# Patient Record
Sex: Male | Born: 1960 | Race: White | Hispanic: No | Marital: Married | Smoking: Never smoker
Health system: Southern US, Community
[De-identification: ages and names within clinical notes are randomized; demographics above are authoritative.]

## PROBLEM LIST (undated history)

## (undated) DIAGNOSIS — I10 Essential (primary) hypertension: Secondary | ICD-10-CM

## (undated) HISTORY — DX: Essential (primary) hypertension: I10

---

## 2019-10-26 ENCOUNTER — Other Ambulatory Visit: Payer: Self-pay

## 2019-10-26 DIAGNOSIS — Z20822 Contact with and (suspected) exposure to covid-19: Secondary | ICD-10-CM

## 2019-10-28 LAB — NOVEL CORONAVIRUS, NAA: SARS-CoV-2, NAA: NOT DETECTED

## 2019-12-19 ENCOUNTER — Ambulatory Visit: Payer: Self-pay | Admitting: Family Medicine

## 2019-12-23 ENCOUNTER — Other Ambulatory Visit: Payer: Self-pay

## 2019-12-23 ENCOUNTER — Encounter: Payer: Self-pay | Admitting: Family Medicine

## 2019-12-23 ENCOUNTER — Ambulatory Visit (INDEPENDENT_AMBULATORY_CARE_PROVIDER_SITE_OTHER): Payer: Managed Care, Other (non HMO) | Admitting: Family Medicine

## 2019-12-23 VITALS — BP 128/80 | HR 68 | Temp 95.5°F | Resp 12 | Ht 67.0 in | Wt 183.0 lb

## 2019-12-23 DIAGNOSIS — R739 Hyperglycemia, unspecified: Secondary | ICD-10-CM

## 2019-12-23 DIAGNOSIS — Z13220 Encounter for screening for lipoid disorders: Secondary | ICD-10-CM

## 2019-12-23 DIAGNOSIS — I10 Essential (primary) hypertension: Secondary | ICD-10-CM

## 2019-12-23 DIAGNOSIS — M2550 Pain in unspecified joint: Secondary | ICD-10-CM

## 2019-12-23 DIAGNOSIS — Z1159 Encounter for screening for other viral diseases: Secondary | ICD-10-CM | POA: Diagnosis not present

## 2019-12-23 DIAGNOSIS — Z9189 Other specified personal risk factors, not elsewhere classified: Secondary | ICD-10-CM

## 2019-12-23 LAB — SEDIMENTATION RATE: Sed Rate: 6 mm/hr (ref 0–20)

## 2019-12-23 LAB — C-REACTIVE PROTEIN: CRP: <1 mg/dL (ref 0.5–20.0)

## 2019-12-23 LAB — BASIC METABOLIC PANEL
BUN: 24 mg/dL — ABNORMAL HIGH (ref 6–23)
CO2: 29 mEq/L (ref 19–32)
Calcium: 9.2 mg/dL (ref 8.4–10.5)
Chloride: 106 mEq/L (ref 96–112)
Creatinine, Ser: 1.03 mg/dL (ref 0.40–1.50)
GFR: 74.31 mL/min (ref >60.00)
Glucose, Bld: 99 mg/dL (ref 70–99)
Potassium: 4.5 mEq/L (ref 3.5–5.1)
Sodium: 141 mEq/L (ref 135–145)

## 2019-12-23 LAB — LIPID PANEL
Cholesterol: 167 mg/dL (ref 0–200)
HDL: 59.9 mg/dL (ref >39.00)
LDL Cholesterol: 85 mg/dL (ref 0–99)
NonHDL: 106.79
Total CHOL/HDL Ratio: 3
Triglycerides: 111 mg/dL (ref 0.0–149.0)
VLDL: 22.2 mg/dL (ref 0.0–40.0)

## 2019-12-23 LAB — HEMOGLOBIN A1C: Hgb A1c MFr Bld: 5.2 % (ref 4.6–6.5)

## 2019-12-23 MED ORDER — LISINOPRIL 10 MG PO TABS: 10.0000 mg | ORAL_TABLET | Freq: Every day | ORAL | 3 refills | Status: DC

## 2019-12-23 NOTE — Patient Instructions (Signed)
A few things to remember from today's visit:   Polyarthralgia - Plan: Rheumatoid Factor, Cyclic citrul peptide antibody, IgG, C-reactive protein, Sed Rate (ESR)  Hyperglycemia - Plan: Hemoglobin A1c  Benign essential hypertension - Plan: Basic Metabolic Panel, lisinopril (ZESTRIL) 10 MG tablet  Encounter for HCV screening test for high risk patient - Plan: Hepatitis C Antibody  Screening for lipid disorders - Plan: Lipid panel  No changes today.  Please be sure medication list is accurate. If a new problem present, please set up appointment sooner than planned today.

## 2019-12-23 NOTE — Progress Notes (Signed)
HPI:   Daniel Fritz is a 59 y.o. male, who is here today to establish care.  Former PCP: Moved from [place] 3 months ago. Last preventive routine visit: He has periodic executive physicals, last one a year ago.  Chronic medical problems: HTN. Dx'ed 18 months ago. He is on Lisinopril 10 mg daily.  He does not check BP at home. Denies severe/frequent headache, visual changes, chest pain, dyspnea, palpitation, claudication, focal weakness, or edema.  States that his glucose has been mildly elevated. No Hx of HLD or DM.   Concerns today:   He wonders if he would benefit from getting the shingles vaccine now. Shingles 7 years ago, since then he has noted intermittent rash on right costal area every year. No numbness or tingling. He has not noted vesicular lesions. + Stress.  Concerned about joint pain, afraid of being RA. Achy like pain. Knees,left-sided back, and right hip pain. FHx of RA.  No edema or erythema. No limitation of ROM.. Pain does  Not interferes with daily activities. He takes Advil 1-2 per week.  Review of Systems  Constitutional: Negative for activity change, appetite change, fatigue and fever.  HENT: Negative for nosebleeds, sore throat and trouble swallowing.   Eyes: Negative for redness and visual disturbance.  Respiratory: Negative for cough and wheezing.   Gastrointestinal: Negative for abdominal pain, nausea and vomiting.  Endocrine: Negative for cold intolerance and heat intolerance.  Genitourinary: Negative for decreased urine volume and hematuria.  Musculoskeletal: Negative for gait problem and myalgias.  Neurological: Negative for syncope and facial asymmetry.  Rest see pertinent positives and negatives per HPI.   No current outpatient medications on file prior to visit.   No current facility-administered medications on file prior to visit.    Past Medical History:  Diagnosis Date  . Hypertension    No Known  Allergies  History reviewed. No pertinent family history.  Social History   Socioeconomic History  . Marital status: Married    Spouse name: Not on file  . Number of children: Not on file  . Years of education: Not on file  . Highest education level: Not on file  Occupational History  . Not on file  Tobacco Use  . Smoking status: Never Smoker  . Smokeless tobacco: Never Used  Substance and Sexual Activity  . Alcohol use: Not on file  . Drug use: Not on file  . Sexual activity: Not on file  Other Topics Concern  . Not on file  Social History Narrative  . Not on file   Social Determinants of Health   Financial Resource Strain:   . Difficulty of Paying Living Expenses: Not on file  Food Insecurity:   . Worried About Charity fundraiser in the Last Year: Not on file  . Ran Out of Food in the Last Year: Not on file  Transportation Needs:   . Lack of Transportation (Medical): Not on file  . Lack of Transportation (Non-Medical): Not on file  Physical Activity:   . Days of Exercise per Week: Not on file  . Minutes of Exercise per Session: Not on file  Stress:   . Feeling of Stress : Not on file  Social Connections:   . Frequency of Communication with Friends and Family: Not on file  . Frequency of Social Gatherings with Friends and Family: Not on file  . Attends Religious Services: Not on file  . Active Member of Clubs or Organizations: Not on  file  . Attends Archivist Meetings: Not on file  . Marital Status: Not on file    Vitals:   12/23/19 0939  BP: 128/80  Pulse: 68  Resp: 12  Temp: (!) 95.5 F (35.3 C)  SpO2: 96%    Body mass index is 28.66 kg/m.  Physical Exam  Nursing note reviewed. Constitutional: He is oriented to person, place, and time. He appears well-developed. No distress.  HENT:  Head: Normocephalic and atraumatic.  Mouth/Throat: Oropharynx is clear and moist and mucous membranes are normal.  Eyes: Pupils are equal, round, and  reactive to light. Conjunctivae are normal.  Cardiovascular: Normal rate and regular rhythm.  No murmur heard. Pulses:      Dorsalis pedis pulses are 2+ on the right side and 2+ on the left side.  Respiratory: Effort normal and breath sounds normal. No respiratory distress.  GI: Soft. He exhibits no mass. There is no hepatomegaly. There is no abdominal tenderness.  Musculoskeletal:        General: No edema.     Lumbar back: No bony tenderness.     Right knee: No deformity, effusion or erythema. Normal range of motion.     Left knee: No deformity or effusion. Normal range of motion. No tenderness.     Comments: Knee crepitus R>L . No signs of synovitis.  Lymphadenopathy:    He has no cervical adenopathy.  Neurological: He is alert and oriented to person, place, and time. He has normal strength. No cranial nerve deficit. Gait normal.  Skin: Skin is warm. No rash noted. No erythema.  Psychiatric: He has a normal mood and affect.  Well groomed, good eye contact.   ASSESSMENT AND PLAN:  Daniel Fritz was seen today for establish care.  Diagnoses and all orders for this visit:  Lab Results  Component Value Date   ESRSEDRATE 6 12/23/2019   Lab Results  Component Value Date   CRP <1.0 12/23/2019   Lab Results  Component Value Date   HGBA1C 5.2 12/23/2019   Lab Results  Component Value Date   CREATININE 1.03 12/23/2019   BUN 24 (H) 12/23/2019   NA 141 12/23/2019   K 4.5 12/23/2019   CL 106 12/23/2019   CO2 29 12/23/2019    Polyarthralgia Hx does not suggest RA but rather osteoarthritis. Caution with chronic NSAID's advice.Tylenol is a better choice, 500 mg 3-4 times per day as needed.  -     Rheumatoid Factor -     Cyclic citrul peptide antibody, IgG -     C-reactive protein -     Sed Rate (ESR)  Hyperglycemia Healthy life style for primary prevention. Further recommendations according to A1C.  -     Hemoglobin A1c  Benign essential hypertension BP adequately  controlled. No changes in current management. Monitor BP regularly. Low salt diet to continue.  -     Basic Metabolic Panel -     lisinopril (ZESTRIL) 10 MG tablet; Take 1 tablet (10 mg total) by mouth daily.  Encounter for HCV screening test for high risk patient -     Hepatitis C Antibody  Screening for lipid disorders -     Lipid panel    Return in about 1 year (around 12/22/2020) for Before if needed..     Duana Benedict G. [place], MD  [facility]. [place] office.

## 2019-12-24 ENCOUNTER — Encounter: Payer: Self-pay | Admitting: Family Medicine

## 2019-12-26 LAB — CYCLIC CITRUL PEPTIDE ANTIBODY, IGG: Cyclic Citrullin Peptide Ab: <16 UNITS

## 2019-12-26 LAB — HEPATITIS C ANTIBODY
Hepatitis C Ab: NONREACTIVE
SIGNAL TO CUT-OFF: 0.03 (ref <1.00)

## 2019-12-26 LAB — RHEUMATOID FACTOR: Rheumatoid fact SerPl-aCnc: <14 IU/mL (ref <14)

## 2019-12-28 ENCOUNTER — Encounter: Payer: Self-pay | Admitting: Family Medicine

## 2020-02-24 ENCOUNTER — Telehealth: Payer: Self-pay | Admitting: Family Medicine

## 2020-02-24 NOTE — Telephone Encounter (Signed)
Pt stated that the medication Lisinopril is making him cough and he would like to be switched to Losartan if possible.  [facility] [address] FAX: [phone]   Pt can be reached at [phone] -ok to leave detailed message per pt

## 2020-02-27 ENCOUNTER — Other Ambulatory Visit: Payer: Self-pay | Admitting: Family Medicine

## 2020-02-27 MED ORDER — LOSARTAN POTASSIUM 25 MG PO TABS: 25.0000 mg | ORAL_TABLET | Freq: Every day | ORAL | 1 refills | Status: DC

## 2020-02-27 NOTE — Telephone Encounter (Signed)
Losartan 25 mg sent to the pharmacy. Please arrange appointment for 3 months med follow-up. Thanks, BJ

## 2020-02-27 NOTE — Telephone Encounter (Signed)
I left patient a voicemail letting him know the new Rx was sent in & to give the office a call back to schedule a 3 month follow up.

## 2020-03-15 ENCOUNTER — Ambulatory Visit: Payer: Managed Care, Other (non HMO) | Attending: Internal Medicine

## 2020-03-15 DIAGNOSIS — Z23 Encounter for immunization: Secondary | ICD-10-CM

## 2020-03-15 NOTE — Progress Notes (Signed)
   Covid-19 Vaccination Clinic  Name:  Maddox Hlavaty    MRN: [id number] DOB: 17-Nov-1961  03/15/2020  Mr. Jarquin was observed post Covid-19 immunization for 15 minutes without incident. He was provided with Vaccine Information Sheet and instruction to access the V-Safe system.   Mr. Mittelstaedt was instructed to call 911 with any severe reactions post vaccine: Marland Kitchen Difficulty breathing  . Swelling of face and throat  . A fast heartbeat  . A bad rash all over body  . Dizziness and weakness   Immunizations Administered    Name Date Dose VIS Date Route   Pfizer COVID-19 Vaccine 03/15/2020 11:30 AM 0.3 mL 11/25/2019 Intramuscular   Manufacturer: [employer], [employer]   Lot: [id number]   NDC: [address]-2

## 2020-08-31 ENCOUNTER — Telehealth: Payer: Self-pay | Admitting: Family Medicine

## 2020-08-31 MED ORDER — LOSARTAN POTASSIUM 25 MG PO TABS: 25.0000 mg | ORAL_TABLET | Freq: Every day | ORAL | 1 refills | Status: DC

## 2020-08-31 NOTE — Telephone Encounter (Signed)
Pts spouse is calling in stating that pt is out of losartan (COZAAR) 25 MG  [facility]:  [facility] [address]  [phone]

## 2020-08-31 NOTE — Telephone Encounter (Signed)
Rx has been sent in as requested.  

## 2020-10-08 ENCOUNTER — Other Ambulatory Visit: Payer: Self-pay

## 2020-10-08 ENCOUNTER — Encounter: Payer: Self-pay | Admitting: Family Medicine

## 2020-10-08 ENCOUNTER — Ambulatory Visit (INDEPENDENT_AMBULATORY_CARE_PROVIDER_SITE_OTHER): Payer: Managed Care, Other (non HMO) | Admitting: Family Medicine

## 2020-10-08 VITALS — BP 142/90 | HR 83 | Resp 12 | Ht 67.0 in | Wt 183.0 lb

## 2020-10-08 DIAGNOSIS — E663 Overweight: Secondary | ICD-10-CM

## 2020-10-08 DIAGNOSIS — R9431 Abnormal electrocardiogram [ECG] [EKG]: Secondary | ICD-10-CM | POA: Diagnosis not present

## 2020-10-08 DIAGNOSIS — Z23 Encounter for immunization: Secondary | ICD-10-CM | POA: Diagnosis not present

## 2020-10-08 DIAGNOSIS — I10 Essential (primary) hypertension: Secondary | ICD-10-CM | POA: Diagnosis not present

## 2020-10-08 MED ORDER — LOSARTAN POTASSIUM 50 MG PO TABS: 50.0000 mg | ORAL_TABLET | Freq: Every day | ORAL | 2 refills | Status: DC

## 2020-10-08 MED ORDER — LOSARTAN POTASSIUM 50 MG PO TABS: 50.0000 mg | ORAL_TABLET | Freq: Every day | ORAL | 3 refills | Status: DC

## 2020-10-08 NOTE — Progress Notes (Signed)
HPI: DanielDaniel Fritz is a 59 y.o. male, who is here today for follow up.   He was last seen on 12/23/2019. No new problems since his last visit. He is concerned about elevated BP. He has checked BP at the grocery store, 140s/90s. He has not noted unusual/severe headache, visual changes, CP, palpitations, dyspnea, edema, or focal weakness.  Currently he is on losartan 25 mg daily. He is tolerating medication well.  He has been under some stress. He quit his job (it was aggravating stress) and his mother passed last week from CVA. She had hx of atrial fib.  Lab Results  Component Value Date   CREATININE 1.03 12/23/2019   BUN 24 (H) 12/23/2019   NA 141 12/23/2019   K 4.5 12/23/2019   CL 106 12/23/2019   CO2 29 12/23/2019   He is concerned about CVD and would like to discuss prevention strategies. No known Hx of CAD,DM II,or HLD.  Lab Results  Component Value Date   HGBA1C 5.2 12/23/2019   Lab Results  Component Value Date   CHOL 167 12/23/2019   HDL 59.90 12/23/2019   LDLCALC 85 12/23/2019   TRIG 111.0 12/23/2019   CHOLHDL 3 12/23/2019   He is trying to follow a healthful diet and exercising regularly.  Review of Systems  Constitutional: Negative for activity change, appetite change and fever.  HENT: Negative for nosebleeds and sore throat.   Respiratory: Negative for cough and wheezing.   Gastrointestinal: Negative for abdominal pain, nausea and vomiting.  Genitourinary: Negative for decreased urine volume, dysuria and hematuria.  Musculoskeletal: Negative for gait problem and myalgias.  Neurological: Negative for syncope, facial asymmetry and weakness.  Psychiatric/Behavioral: Negative for confusion.   Rest of ROS, see pertinent positives sand negatives in HPI  No current outpatient medications on file prior to visit.   No current facility-administered medications on file prior to visit.   Past Medical History:  Diagnosis Date  . Hypertension    No Known  Allergies  Social History   Socioeconomic History  . Marital status: Married    Spouse name: Not on file  . Number of children: Not on file  . Years of education: Not on file  . Highest education level: Not on file  Occupational History  . Not on file  Tobacco Use  . Smoking status: Never Smoker  . Smokeless tobacco: Never Used  Substance and Sexual Activity  . Alcohol use: Not on file  . Drug use: Not on file  . Sexual activity: Not on file  Other Topics Concern  . Not on file  Social History Narrative  . Not on file   Social Determinants of Health   Financial Resource Strain:   . Difficulty of Paying Living Expenses: Not on file  Food Insecurity:   . Worried About Programme researcher, broadcasting/film/videounning Out of Food in the Last Year: Not on file  . Ran Out of Food in the Last Year: Not on file  Transportation Needs:   . Lack of Transportation (Medical): Not on file  . Lack of Transportation (Non-Medical): Not on file  Physical Activity:   . Days of Exercise per Week: Not on file  . Minutes of Exercise per Session: Not on file  Stress:   . Feeling of Stress : Not on file  Social Connections:   . Frequency of Communication with Friends and Family: Not on file  . Frequency of Social Gatherings with Friends and Family: Not on file  . Attends  Religious Services: Not on file  . Active Member of Clubs or Organizations: Not on file  . Attends Banker Meetings: Not on file  . Marital Status: Not on file   Vitals:   10/08/20 1134  BP: (!) 142/90  Pulse: 83  Resp: 12  SpO2: 96%   Wt Readings from Last 3 Encounters:  10/08/20 183 lb (83 kg)  12/23/19 183 lb (83 kg)   Body mass index is 28.66 kg/m.  Physical Exam Vitals and nursing note reviewed.  Constitutional:      General: He is not in acute distress.    Appearance: He is well-developed and well-groomed.  HENT:     Head: Normocephalic and atraumatic.     Mouth/Throat:     Mouth: Mucous membranes are moist.     Pharynx:  Oropharynx is clear.  Eyes:     Conjunctiva/sclera: Conjunctivae normal.     Pupils: Pupils are equal, round, and reactive to light.  Cardiovascular:     Rate and Rhythm: Normal rate and regular rhythm.     Pulses:          Dorsalis pedis pulses are 2+ on the right side and 2+ on the left side.     Heart sounds: No murmur heard.   Pulmonary:     Effort: Pulmonary effort is normal. No respiratory distress.     Breath sounds: Normal breath sounds.  Abdominal:     Palpations: Abdomen is soft. There is no hepatomegaly or mass.     Tenderness: There is no abdominal tenderness.  Lymphadenopathy:     Cervical: No cervical adenopathy.  Skin:    General: Skin is warm.     Findings: No erythema or rash.  Neurological:     General: No focal deficit present.     Mental Status: He is alert and oriented to person, place, and time.     Cranial Nerves: No cranial nerve deficit.     Gait: Gait normal.   ASSESSMENT AND PLAN:  Daniel Fritz was seen today for follow-up.  Orders Placed This Encounter  Procedures  . Flu Vaccine QUAD 36+ mos IM  . BASIC METABOLIC PANEL WITH GFR  . Ambulatory referral to Cardiology  . EKG 12-Lead   Lab Results  Component Value Date   CREATININE 1.07 10/08/2020   BUN 19 10/08/2020   NA 139 10/08/2020   K 4.6 10/08/2020   CL 105 10/08/2020   CO2 26 10/08/2020    Benign essential hypertension Re-checked: 130/98. Recommend increasing dose of losartan from 25 mg to 50 mg. Monitor BP at home. Continue low-salt diet.  We discussed current recommendations in regard to primary CVD prevention.  Overweight (BMI 25.0-29.9) Weight has been stable. Continue following a healthful diet and regular physical activity.  Abnormal EKG Reporting having stress test and EKG's in the past, he has not been told about abnormalities. Asymptomatic. EKG today:SR, LAD, ? LAFB,,uns T wave abnormalities inferior leads.  No other EKG available for comparison. Appointment  with cardiology will be arranged.  Need for influenza vaccination -     Flu Vaccine QUAD 36+ mos IM   Return in about 2 months (around 12/18/2020) for CPE.  Karthikeya Funke G. [place], MD  [facility]. [place] office.   A few things to remember from today's visit:   Benign essential hypertension - Plan: EKG 12-Lead  Need for influenza vaccination - Plan: Flu Vaccine QUAD 36+ mos IM  Today losartan dose increased from 25  mg to 50 mg. Monitor blood pressure at home. Continue low-salt diet.  Heart Disease Prevention Heart disease is the leading cause of death in the world. Coronary artery disease is the most common cause of heart disease. This condition results when cholesterol and other substances (plaque) build up inside the walls of the blood vessels that supply your heart muscle (arteries). This buildup in arteries is called atherosclerosis. You can take actions to lower your risk of heart disease. How can heart disease affect me? Heart disease can cause many unpleasant symptoms and complications, such as:  Chest pain (angina).  Reduced or blocked blood flow to your heart. This can cause: ? Irregular heartbeats (arrhythmias). ? Heart attack. ? Heart failure. What can increase my risk? The following factors may make you more likely to develop this condition:  High blood pressure (hypertension).  High cholesterol.  Smoking.  A diet high in saturated fats or trans fats.  Lack of physical activity.  Obesity.  Drinking too much alcohol.  Diabetes.  Having a family history of heart disease. What actions can I take to prevent heart disease? Nutrition   Eat a heart-healthy eating plan as told by your health care provider. Examples include the DASH (Dietary Approaches to Stop Hypertension) eating plan or the Mediterranean diet.  Generally, it is recommended that you: ? Eat less salt (sodium). Ask your health care provider how much sodium is safe for you. Most  people should have less than 2,300 mg each day. ? Limit unhealthy fats, such as saturated and trans fats, in your diet. You can do this by eating low-fat dairy products, eating less red meat, and avoiding processed foods. ? Eat healthy fats (omega-3 fatty acids). These are found in fish, such as mackerel or salmon. ? Eat more fruits and vegetables. You should try to fill one-half of your plate with fruits and vegetables at each meal. ? Eat more whole grains. ? Avoid foods and drinks that have added sugars. Lifestyle   Get regular exercise. This is one of the most important things you can do for your health. Generally, it is recommended that you: ? Exercise for at least 30 minutes on most days of the week (150 minutes each week). The exercise should increase your heart rate and make you sweat (aerobic exercise). ? Add strength exercises on at least 2 days each week.  Do not use any products that contain nicotine or tobacco, such as cigarettes and e-cigarettes. These can damage your heart and blood vessels. If you need help quitting, ask your health care provider. Alcohol use  Do not drink alcohol if: ? Your health care provider tells you not to drink. ? You are pregnant, may be pregnant, or are planning to become pregnant.  If you drink alcohol, limit how much you have: ? 0-1 drink a day for women. ? 0-2 drinks a day for men.  Be aware of how much alcohol is in your drink. In the [place] one drink equals one typical bottle of beer (12 oz), one-half glass of wine (5 oz), or one shot of hard liquor (1 oz). Medicines  Take over-the-counter and prescription medicines only as told by your health care provider.  Ask your health care provider whether you should take an aspirin every day. Taking aspirin may help reduce your risk of heart disease and stroke.  Depending on your risk factors, your health care provider may prescribe medicines to lower your risk of heart disease or to control related  conditions.  You may take medicine to: ? Lower cholesterol. ? Control blood pressure. ? Control diabetes. General information  Keep your blood pressure under control, as recommended by your health care provider. For most healthy people, the upper number of your blood pressure (systolic) should be no higher than 120, and the lower number (diastolic) no higher than 80. Treatment may be needed if your blood pressure is higher than 130/80.  Have your blood pressure checked at least every two years. Your health care provider may check your blood pressure more often if you have high blood pressure.  After age 21, have your cholesterol checked every 4-6 years. If you have risk factors for heart disease, you may need to have it checked more frequently. Treatment may be needed if your cholesterol is high.  Have your body mass index (BMI) checked every year. Your health care provider can calculate your BMI from your height and weight.  Work with your health care provider to lose weight, if needed, or to maintain a healthy weight. Where to find more information:  Centers for Disease Control and Prevention: https://ball-collins.biz/  American Heart Association: www.heart.org ? Take a free online heart disease risk quiz to better understand your personal risk factors. Summary  Heart disease is the leading cause of death in the world.  Heart disease can cause chest pain, abnormal heart rhythms, heart attack, and heart failure.  High blood pressure, high cholesterol, and smoking are the main risk factors for heart disease, although other factors also contribute.  You can take actions to lower your chances of developing heart disease. Work with your health care provider to reduce your risk by following a heart-healthy diet, being physically active, and controlling your weight, blood pressure, and cholesterol level. This information is not intended to replace advice given to you by your health care  provider. Make sure you discuss any questions you have with your health care provider. Document Revised: 12/16/2017 Document Reviewed: 12/16/2017 Elsevier Patient Education  [employer].  If you need refills please call your pharmacy. Do not use My Chart to request refills or for acute issues that need immediate attention.    Please be sure medication list is accurate. If a new problem present, please set up appointment sooner than planned today.

## 2020-10-08 NOTE — Patient Instructions (Signed)
A few things to remember from today's visit:   Benign essential hypertension - Plan: EKG 12-Lead  Need for influenza vaccination - Plan: Flu Vaccine QUAD 36+ mos IM  Today losartan dose increased from 25 mg to 50 mg. Monitor blood pressure at home. Continue low-salt diet.  Heart Disease Prevention Heart disease is the leading cause of death in the world. Coronary artery disease is the most common cause of heart disease. This condition results when cholesterol and other substances (plaque) build up inside the walls of the blood vessels that supply your heart muscle (arteries). This buildup in arteries is called atherosclerosis. You can take actions to lower your risk of heart disease. How can heart disease affect me? Heart disease can cause many unpleasant symptoms and complications, such as:  Chest pain (angina).  Reduced or blocked blood flow to your heart. This can cause: ? Irregular heartbeats (arrhythmias). ? Heart attack. ? Heart failure. What can increase my risk? The following factors may make you more likely to develop this condition:  High blood pressure (hypertension).  High cholesterol.  Smoking.  A diet high in saturated fats or trans fats.  Lack of physical activity.  Obesity.  Drinking too much alcohol.  Diabetes.  Having a family history of heart disease. What actions can I take to prevent heart disease? Nutrition   Eat a heart-healthy eating plan as told by your health care provider. Examples include the DASH (Dietary Approaches to Stop Hypertension) eating plan or the Mediterranean diet.  Generally, it is recommended that you: ? Eat less salt (sodium). Ask your health care provider how much sodium is safe for you. Most people should have less than 2,300 mg each day. ? Limit unhealthy fats, such as saturated and trans fats, in your diet. You can do this by eating low-fat dairy products, eating less red meat, and avoiding processed foods. ? Eat  healthy fats (omega-3 fatty acids). These are found in fish, such as mackerel or salmon. ? Eat more fruits and vegetables. You should try to fill one-half of your plate with fruits and vegetables at each meal. ? Eat more whole grains. ? Avoid foods and drinks that have added sugars. Lifestyle   Get regular exercise. This is one of the most important things you can do for your health. Generally, it is recommended that you: ? Exercise for at least 30 minutes on most days of the week (150 minutes each week). The exercise should increase your heart rate and make you sweat (aerobic exercise). ? Add strength exercises on at least 2 days each week.  Do not use any products that contain nicotine or tobacco, such as cigarettes and e-cigarettes. These can damage your heart and blood vessels. If you need help quitting, ask your health care provider. Alcohol use  Do not drink alcohol if: ? Your health care provider tells you not to drink. ? You are pregnant, may be pregnant, or are planning to become pregnant.  If you drink alcohol, limit how much you have: ? 0-1 drink a day for women. ? 0-2 drinks a day for men.  Be aware of how much alcohol is in your drink. In the [place] one drink equals one typical bottle of beer (12 oz), one-half glass of wine (5 oz), or one shot of hard liquor (1 oz). Medicines  Take over-the-counter and prescription medicines only as told by your health care provider.  Ask your health care provider whether you should take an aspirin every day. Taking  aspirin may help reduce your risk of heart disease and stroke.  Depending on your risk factors, your health care provider may prescribe medicines to lower your risk of heart disease or to control related conditions. You may take medicine to: ? Lower cholesterol. ? Control blood pressure. ? Control diabetes. General information  Keep your blood pressure under control, as recommended by your health care provider. For most  healthy people, the upper number of your blood pressure (systolic) should be no higher than 120, and the lower number (diastolic) no higher than 80. Treatment may be needed if your blood pressure is higher than 130/80.  Have your blood pressure checked at least every two years. Your health care provider may check your blood pressure more often if you have high blood pressure.  After age 16, have your cholesterol checked every 4-6 years. If you have risk factors for heart disease, you may need to have it checked more frequently. Treatment may be needed if your cholesterol is high.  Have your body mass index (BMI) checked every year. Your health care provider can calculate your BMI from your height and weight.  Work with your health care provider to lose weight, if needed, or to maintain a healthy weight. Where to find more information:  Centers for Disease Control and Prevention: https://ball-collins.biz/  American Heart Association: www.heart.org ? Take a free online heart disease risk quiz to better understand your personal risk factors. Summary  Heart disease is the leading cause of death in the world.  Heart disease can cause chest pain, abnormal heart rhythms, heart attack, and heart failure.  High blood pressure, high cholesterol, and smoking are the main risk factors for heart disease, although other factors also contribute.  You can take actions to lower your chances of developing heart disease. Work with your health care provider to reduce your risk by following a heart-healthy diet, being physically active, and controlling your weight, blood pressure, and cholesterol level. This information is not intended to replace advice given to you by your health care provider. Make sure you discuss any questions you have with your health care provider. Document Revised: 12/16/2017 Document Reviewed: 12/16/2017 Elsevier Patient Education  [employer].  If you need refills please call  your pharmacy. Do not use My Chart to request refills or for acute issues that need immediate attention.    Please be sure medication list is accurate. If a new problem present, please set up appointment sooner than planned today.

## 2020-10-09 LAB — BASIC METABOLIC PANEL WITH GFR
BUN: 19 mg/dL (ref 7–25)
CO2: 26 mmol/L (ref 20–32)
Calcium: 9.5 mg/dL (ref 8.6–10.3)
Chloride: 105 mmol/L (ref 98–110)
Creat: 1.07 mg/dL (ref 0.70–1.33)
GFR, Est African American: 88 mL/min/{1.73_m2} (ref >=60)
GFR, Est Non African American: 76 mL/min/{1.73_m2} (ref >=60)
Glucose, Bld: 99 mg/dL (ref 65–99)
Potassium: 4.6 mmol/L (ref 3.5–5.3)
Sodium: 139 mmol/L (ref 135–146)

## 2020-10-09 NOTE — Progress Notes (Signed)
Cardiology Office Note:   Date:  10/10/2020  NAME:  Daniel Fritz    MRN: [id number] DOB:  01-12-1961   PCP:  [place], Betty G, MD  Cardiologist:  No primary care provider on file.  Electrophysiologist:  None   Referring MD: [place], Betty G, MD   Chief Complaint  Patient presents with  . New Patient (Initial Visit)  . Shortness of Breath   History of Present Illness:   Daniel Fritz is a 59 y.o. male with a hx of HTN who is being seen today for the evaluation of abnormal EKG at the request of [place], Timoteo Expose, MD.  He reports he was recently seen by his PCP.  EKG showed nonspecific ST-T changes.  He was sent for evaluation.  He does have a history of hypertension is well controlled on losartan.  He reports he has no chest pain or shortness of breath.  He reports when he does some heavy exertion he may get a little shortness of breath but no chest pain or tightness.  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  Most recent cholesterol shown below.  A1c below.  Cardiovascular examination is normal.  He is a never smoker.  Rarely consumes alcohol.  No illicit drugs.  There is no strong family history of heart disease.  Overall without symptoms.  We did discuss calcium scoring for further risk ratification.  He is interested.  He also needs an echocardiogram for his EKG.  T chol 167, HDL 59, LDL 85, TG 111, A1c 5.5  Past Medical History: Past Medical History:  Diagnosis Date  . Hypertension     Past Surgical History: History reviewed. No pertinent surgical history.  Current Medications: Current Meds  Medication Sig  . losartan (COZAAR) 50 MG tablet Take 1 tablet (50 mg total) by mouth daily.     Allergies:    Patient has no known allergies.   Social History: Social History   Socioeconomic History  . Marital status: Married    Spouse name: Not on file  . Number of children: 2  . Years of education: Not on file  . Highest education level: Not on file    Occupational History  . Occupation: Finance  Tobacco Use  . Smoking status: Never Smoker  . Smokeless tobacco: Never Used  Substance and Sexual Activity  . Alcohol use: Yes    Alcohol/week: 2.0 standard drinks    Types: 2 Glasses of wine per week  . Drug use: Never  . Sexual activity: Not on file  Other Topics Concern  . Not on file  Social History Narrative  . Not on file   Social Determinants of Health   Financial Resource Strain:   . Difficulty of Paying Living Expenses: Not on file  Food Insecurity:   . Worried About Programme researcher, broadcasting/film/video in the Last Year: Not on file  . Ran Out of Food in the Last Year: Not on file  Transportation Needs:   . Lack of Transportation (Medical): Not on file  . Lack of Transportation (Non-Medical): Not on file  Physical Activity:   . Days of Exercise per Week: Not on file  . Minutes of Exercise per Session: Not on file  Stress:   . Feeling of Stress : Not on file  Social Connections:   . Frequency of Communication with Friends and Family: Not on file  . Frequency of Social Gatherings with Friends and Family: Not on file  .  Attends Religious Services: Not on file  . Active Member of Clubs or Organizations: Not on file  . Attends Banker Meetings: Not on file  . Marital Status: Not on file     Family History: The patient's family history includes Arrhythmia in his mother; Stroke in his mother.  ROS:   All other ROS reviewed and negative. Pertinent positives noted in the HPI.     EKGs/Labs/Other Studies Reviewed:   The following studies were personally reviewed by me today:  EKG:  EKG is ordered today.  The ekg ordered today demonstrates normal sinus rhythm, heart rate 67, nonspecific ST-T changes, and was personally reviewed by me.   Recent Labs: 10/08/2020: BUN 19; Creat 1.07; Potassium 4.6; Sodium 139   Recent Lipid Panel    Component Value Date/Time   CHOL 167 12/23/2019 1012   TRIG 111.0 12/23/2019 1012   HDL  59.90 12/23/2019 1012   CHOLHDL 3 12/23/2019 1012   VLDL 22.2 12/23/2019 1012   LDLCALC 85 12/23/2019 1012    Physical Exam:   VS:  BP 130/86 (BP Location: Left Arm, Patient Position: Sitting, Cuff Size: Normal)   Pulse 67   Ht 5\' 7"  (1.702 m)   Wt 187 lb (84.8 kg)   BMI 29.29 kg/m    Wt Readings from Last 3 Encounters:  10/10/20 187 lb (84.8 kg)  10/08/20 183 lb (83 kg)  12/23/19 183 lb (83 kg)    General: Well nourished, well developed, in no acute distress Heart: Atraumatic, normal size  Eyes: PEERLA, EOMI  Neck: Supple, no JVD Endocrine: No thryomegaly Cardiac: Normal S1, S2; RRR; no murmurs, rubs, or gallops Lungs: Clear to auscultation bilaterally, no wheezing, rhonchi or rales  Abd: Soft, nontender, no hepatomegaly  Ext: No edema, pulses 2+ Musculoskeletal: No deformities, BUE and BLE strength normal and equal Skin: Warm and dry, no rashes   Neuro: Alert and oriented to person, place, time, and situation, CNII-XII grossly intact, no focal deficits  Psych: Normal mood and affect   ASSESSMENT:   Daniel Fritz is a 59 y.o. male who presents for the following: 1. Nonspecific abnormal electrocardiogram (ECG) (EKG)   2. Primary hypertension     PLAN:   1. Nonspecific abnormal electrocardiogram (ECG) (EKG) 2. Primary hypertension -EKG with nonspecific ST-T changes.  Recommend echocardiogram.  Suspect these are all blood pressure related.  Cardiovascular examination is normal. -We have discussed coronary calcium scoring for further stratification.  We will proceed with this.  I will follow-up with him the results by phone. If everything is normal he will see 41 as needed.  Disposition: Return if symptoms worsen or fail to improve.  Medication Adjustments/Labs and Tests Ordered: Current medicines are reviewed at length with the patient today.  Concerns regarding medicines are outlined above.  Orders Placed This Encounter  Procedures  . CT CARDIAC SCORING  . EKG 12-Lead   . ECHOCARDIOGRAM COMPLETE   No orders of the defined types were placed in this encounter.   Patient Instructions  Medication Instructions:  Continue same medications   Lab Work: None ordered   Testing/Procedures: Echo Coronary Calcium Score   Follow-Up: At [facility], you and your health needs are our priority.  As part of our continuing mission to provide you with exceptional heart care, we have created designated Provider Care Teams.  These Care Teams include your primary Cardiologist (physician) and Advanced Practice Providers (APPs -  Physician Assistants and Nurse Practitioners) who all work together to provide  you with the care you need, when you need it.  We recommend signing up for the patient portal called "MyChart".  Sign up information is provided on this After Visit Summary.  MyChart is used to connect with patients for Virtual Visits (Telemedicine).  Patients are able to view lab/test results, encounter notes, upcoming appointments, etc.  Non-urgent messages can be sent to your provider as well.   To learn more about what you can do with MyChart, go to [email or web address].    Your next appointment:  As Needed   The format for your next appointment: Office     Provider: Dr.O'Neal       Signed, Lenna Gilford. Flora Lipps, MD [facility]  [address] [place], [place] [address] [phone]  10/10/2020 5:00 PM

## 2020-10-10 ENCOUNTER — Ambulatory Visit (INDEPENDENT_AMBULATORY_CARE_PROVIDER_SITE_OTHER): Payer: Managed Care, Other (non HMO) | Admitting: Cardiovascular Disease

## 2020-10-10 ENCOUNTER — Encounter: Payer: Self-pay | Admitting: Cardiovascular Disease

## 2020-10-10 ENCOUNTER — Other Ambulatory Visit: Payer: Self-pay

## 2020-10-10 VITALS — BP 130/86 | HR 67 | Ht 67.0 in | Wt 187.0 lb

## 2020-10-10 DIAGNOSIS — I10 Essential (primary) hypertension: Secondary | ICD-10-CM

## 2020-10-10 DIAGNOSIS — R9431 Abnormal electrocardiogram [ECG] [EKG]: Secondary | ICD-10-CM

## 2020-10-10 NOTE — Patient Instructions (Signed)
Medication Instructions:  Continue same medications   Lab Work: None ordered   Testing/Procedures: Echo Coronary Calcium Score   Follow-Up: At [facility], you and your health needs are our priority.  As part of our continuing mission to provide you with exceptional heart care, we have created designated Provider Care Teams.  These Care Teams include your primary Cardiologist (physician) and Advanced Practice Providers (APPs -  Physician Assistants and Nurse Practitioners) who all work together to provide you with the care you need, when you need it.  We recommend signing up for the patient portal called "MyChart".  Sign up information is provided on this After Visit Summary.  MyChart is used to connect with patients for Virtual Visits (Telemedicine).  Patients are able to view lab/test results, encounter notes, upcoming appointments, etc.  Non-urgent messages can be sent to your provider as well.   To learn more about what you can do with MyChart, go to [email or web address].    Your next appointment:  As Needed   The format for your next appointment: Office     Provider: Dr.O'Neal

## 2020-10-31 ENCOUNTER — Other Ambulatory Visit: Payer: Self-pay

## 2020-10-31 ENCOUNTER — Ambulatory Visit
Admission: RE | Admit: 2020-10-31 | Discharge: 2020-10-31 | Disposition: A | Discharge status: Home / Self Care | Payer: Self-pay | Source: Ambulatory Visit | Attending: Cardiovascular Disease | Admitting: Cardiovascular Disease

## 2020-10-31 ENCOUNTER — Ambulatory Visit (HOSPITAL_COMMUNITY): Payer: Managed Care, Other (non HMO) | Attending: Internal Medicine

## 2020-10-31 DIAGNOSIS — R9431 Abnormal electrocardiogram [ECG] [EKG]: Secondary | ICD-10-CM

## 2020-10-31 DIAGNOSIS — I10 Essential (primary) hypertension: Secondary | ICD-10-CM | POA: Diagnosis not present

## 2020-10-31 LAB — ECHOCARDIOGRAM COMPLETE
Area-P 1/2: 2.42 cm2
S' Lateral: 2.8 cm

## 2020-11-06 ENCOUNTER — Telehealth: Payer: Self-pay | Admitting: *Deleted

## 2020-11-06 DIAGNOSIS — E785 Hyperlipidemia, unspecified: Secondary | ICD-10-CM

## 2020-11-06 MED ORDER — ROSUVASTATIN CALCIUM 20 MG PO TABS: 20.0000 mg | ORAL_TABLET | Freq: Every day | ORAL | 3 refills | Status: DC

## 2020-11-06 NOTE — Telephone Encounter (Signed)
Pt aware of his blood work, pt agree to start Crestor 20 mg daily and an Aspirin 81 mg by mouth daily, Fasting Lipid lab slip mail to pt to get blood work done in 3 month. appt schedule on 02/23 with Dr Flora Lipps

## 2020-11-06 NOTE — Telephone Encounter (Signed)
-----   Message from Sande Rives, MD sent at 11/01/2020  7:27 AM EST ----- Need to start aspirin 81 mg daily and crestor 20 mg daily. Also needs to see me in 3 months with a fasting lipid the week before.   -W

## 2020-12-18 ENCOUNTER — Encounter: Payer: Self-pay | Admitting: Family Medicine

## 2020-12-28 ENCOUNTER — Encounter: Payer: Managed Care, Other (non HMO) | Admitting: Family Medicine

## 2021-02-03 NOTE — Progress Notes (Signed)
Cardiology Office Note:   Date:  02/06/2021  NAME:  Daniel Fritz    MRN: [id number] DOB:  [birth year]   PCP:  [place], Betty G, MD  Cardiologist:  No primary care provider on file.   Referring MD: [place], Betty G, MD   Chief Complaint  Patient presents with  . Coronary Artery Disease   History of Present Illness:   Daniel Fritz is a 60 y.o. male with a hx of CAD (elevated calcium score) and HLD who presents for follow-up.  We discussed his recent calcium scoring.  He was started on Crestor.  Doing well on this.  Most recent LDL cholesterol not at goal.  He was started on Crestor 20 mg daily.  Also started on aspirin.  No bleeding issues.  He reports he is exercising daily.  This includes up to 30 minutes of weight training or aerobic activity.  No significant chest pain or pressure.  No shortness of breath.  BP 138/82.  He is on losartan.  Seems to be well controlled.  Denies any symptoms today.  We do need to recheck his lipid profile.  He is okay to do this in the next few weeks.  Problem List 1. CAD -coronary calcium score 121 (75th percentile) 2. HLD -T chol 167, HDL 59, LDL 85, TG 111  Past Medical History: Past Medical History:  Diagnosis Date  . Hypertension     Past Surgical History: History reviewed. No pertinent surgical history.  Current Medications: Current Meds  Medication Sig  . aspirin EC 81 MG tablet Take 81 mg by mouth daily. Swallow whole.  . losartan (COZAAR) 50 MG tablet Take 1 tablet (50 mg total) by mouth daily.  . rosuvastatin (CRESTOR) 20 MG tablet Take 1 tablet (20 mg total) by mouth daily.     Allergies:    Patient has no known allergies.   Social History: Social History   Socioeconomic History  . Marital status: Married    Spouse name: Not on file  . Number of children: 2  . Years of education: Not on file  . Highest education level: Not on file  Occupational History  . Occupation: Finance  Tobacco Use  . Smoking status: Never Smoker  .  Smokeless tobacco: Never Used  Substance and Sexual Activity  . Alcohol use: Yes    Alcohol/week: 2.0 standard drinks    Types: 2 Glasses of wine per week  . Drug use: Never  . Sexual activity: Not on file  Other Topics Concern  . Not on file  Social History Narrative  . Not on file   Social Determinants of Health   Financial Resource Strain: Not on file  Food Insecurity: Not on file  Transportation Needs: Not on file  Physical Activity: Not on file  Stress: Not on file  Social Connections: Not on file     Family History: The patient's family history includes Arrhythmia in his mother; Stroke in his mother.  ROS:   All other ROS reviewed and negative. Pertinent positives noted in the HPI.     EKGs/Labs/Other Studies Reviewed:   The following studies were personally reviewed by me today:  Recent Labs: 10/08/2020: BUN 19; Creat 1.07; Potassium 4.6; Sodium 139   Recent Lipid Panel    Component Value Date/Time   CHOL 167 12/23/2019 1012   TRIG 111.0 12/23/2019 1012   HDL 59.90 12/23/2019 1012   CHOLHDL 3 12/23/2019 1012   VLDL 22.2 12/23/2019 1012   LDLCALC 85 12/23/2019 1012  Physical Exam:   VS:  BP 138/82   Pulse 60   Ht 5\' 7"  (1.702 m)   Wt 182 lb (82.6 kg)   SpO2 97%   BMI 28.51 kg/m    Wt Readings from Last 3 Encounters:  02/06/21 182 lb (82.6 kg)  10/10/20 187 lb (84.8 kg)  10/08/20 183 lb (83 kg)    General: Well nourished, well developed, in no acute distress Head: Atraumatic, normal size  Eyes: PEERLA, EOMI  Neck: Supple, no JVD Endocrine: No thryomegaly Cardiac: Normal S1, S2; RRR; no murmurs, rubs, or gallops Lungs: Clear to auscultation bilaterally, no wheezing, rhonchi or rales  Abd: Soft, nontender, no hepatomegaly  Ext: No edema, pulses 2+ Musculoskeletal: No deformities, BUE and BLE strength normal and equal Skin: Warm and dry, no rashes   Neuro: Alert and oriented to person, place, time, and situation, CNII-XII grossly intact, no  focal deficits  Psych: Normal mood and affect   ASSESSMENT:   Daniel Fritz is a 60 y.o. male who presents for the following: 1. Coronary artery disease involving native coronary artery of native heart without angina pectoris   2. Agatston CAC score 100-199   3. Mixed hyperlipidemia     PLAN:   1. Coronary artery disease involving native coronary artery of native heart without angina pectoris 2. Agatston CAC score 100-199 3. Mixed hyperlipidemia -Coronary calcium score in the 75th percentile.  Echocardiogram normal.  No symptoms of angina.  Most recent LDL cholesterol is 85.  He was started on Crestor 20 mg daily.  We will recheck a fasting lipid profile in the next few weeks.  He will continue aspirin 81 mg daily.  He has no significant symptoms that are concerning.  We will continue with preventive approach.  He was counseled on diet and exercise to reduce his risk of cardiovascular disease.  Disposition: Return in about 1 year (around 02/06/2022).  Medication Adjustments/Labs and Tests Ordered: Current medicines are reviewed at length with the patient today.  Concerns regarding medicines are outlined above.  Orders Placed This Encounter  Procedures  . Lipid panel   No orders of the defined types were placed in this encounter.   Patient Instructions  Medication Instructions:  The current medical regimen is effective;  continue present plan and medications.  *If you need a refill on your cardiac medications before your next appointment, please call your pharmacy*   Lab Work: LIPID (come back fasting- nothing to eat or drink, no lab appointment needed)  If you have labs (blood work) drawn today and your tests are completely normal, you will receive your results only by: 02/08/2022 MyChart Message (if you have MyChart) OR . A paper copy in the mail If you have any lab test that is abnormal or we need to change your treatment, we will call you to review the results.   Follow-Up: At  [facility], you and your health needs are our priority.  As part of our continuing mission to provide you with exceptional heart care, we have created designated Provider Care Teams.  These Care Teams include your primary Cardiologist (physician) and Advanced Practice Providers (APPs -  Physician Assistants and Nurse Practitioners) who all work together to provide you with the care you need, when you need it.  We recommend signing up for the patient portal called "MyChart".  Sign up information is provided on this After Visit Summary.  MyChart is used to connect with patients for Virtual Visits (Telemedicine).  Patients are  able to view lab/test results, encounter notes, upcoming appointments, etc.  Non-urgent messages can be sent to your provider as well.   To learn more about what you can do with MyChart, go to [email or web address].    Your next appointment:   12 month(s)  The format for your next appointment:   In Person  Provider:   Lennie Odor, MD         Time Spent with Patient: I have spent a total of 25 minutes with patient reviewing hospital notes, telemetry, EKGs, labs and examining the patient as well as establishing an assessment and plan that was discussed with the patient.  > 50% of time was spent in direct patient care.  Signed, Lenna Gilford. Flora Lipps, MD [facility]  [address] [place], [place] [address] [phone]  02/06/2021 11:19 AM

## 2021-02-06 ENCOUNTER — Encounter: Payer: Self-pay | Admitting: Cardiovascular Disease

## 2021-02-06 ENCOUNTER — Ambulatory Visit: Payer: Managed Care, Other (non HMO) | Admitting: Cardiovascular Disease

## 2021-02-06 ENCOUNTER — Other Ambulatory Visit: Payer: Self-pay

## 2021-02-06 VITALS — BP 138/82 | HR 60 | Ht 67.0 in | Wt 182.0 lb

## 2021-02-06 DIAGNOSIS — I2510 Atherosclerotic heart disease of native coronary artery without angina pectoris: Secondary | ICD-10-CM

## 2021-02-06 DIAGNOSIS — R931 Abnormal findings on diagnostic imaging of heart and coronary circulation: Secondary | ICD-10-CM

## 2021-02-06 DIAGNOSIS — E782 Mixed hyperlipidemia: Secondary | ICD-10-CM

## 2021-02-06 NOTE — Patient Instructions (Signed)
Medication Instructions:  The current medical regimen is effective;  continue present plan and medications.  *If you need a refill on your cardiac medications before your next appointment, please call your pharmacy*   Lab Work: LIPID (come back fasting- nothing to eat or drink, no lab appointment needed)  If you have labs (blood work) drawn today and your tests are completely normal, you will receive your results only by: . MyChart Message (if you have MyChart) OR . A paper copy in the mail If you have any lab test that is abnormal or we need to change your treatment, we will call you to review the results.   Follow-Up: At [facility], you and your health needs are our priority.  As part of our continuing mission to provide you with exceptional heart care, we have created designated Provider Care Teams.  These Care Teams include your primary Cardiologist (physician) and Advanced Practice Providers (APPs -  Physician Assistants and Nurse Practitioners) who all work together to provide you with the care you need, when you need it.  We recommend signing up for the patient portal called "MyChart".  Sign up information is provided on this After Visit Summary.  MyChart is used to connect with patients for Virtual Visits (Telemedicine).  Patients are able to view lab/test results, encounter notes, upcoming appointments, etc.  Non-urgent messages can be sent to your provider as well.   To learn more about what you can do with MyChart, go to [email or web address].    Your next appointment:   12 month(s)  The format for your next appointment:   In Person  Provider:   Havre O'Neal, MD     

## 2021-10-16 IMAGING — CT CT CARDIAC CORONARY ARTERY CALCIUM SCORE
2 series · 16 of 20 positions shown, 18 images · non-contrast
Comparison: None.
COMPARISON: None.

Addendum:
EXAM:
OVER-READ INTERPRETATION  CT CHEST

The following report is an over-read performed by radiologist Dr.
Fanm Hormasabal [REDACTED] on 10/31/2020. This
over-read does not include interpretation of cardiac or coronary
anatomy or pathology. The coronary calcium score/coronary CTA
interpretation by the cardiologist is attached.
CLINICAL DATA: Risk stratification
Coronary Calcium Score
TECHNIQUE: The patient was scanned on a Siemens Force scanner. Axial
non-contrast 3 mm slices were carried out through the heart. The
data set was analyzed on a dedicated work station and scored using
the Agatson method.

[Series 2: casc 3.0 bv41 2 bestdiast 74 % · axial · 0.39mm/px · z∈[-294,-198]mm · 8 of 42 slices shown, 10 images]
[im 5/42  vessel]
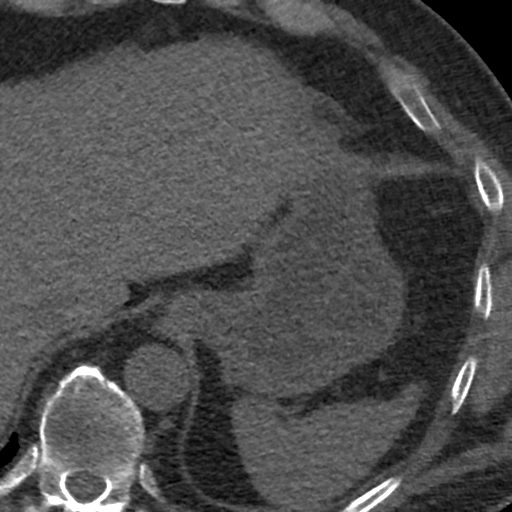
[im 5/42  lung]
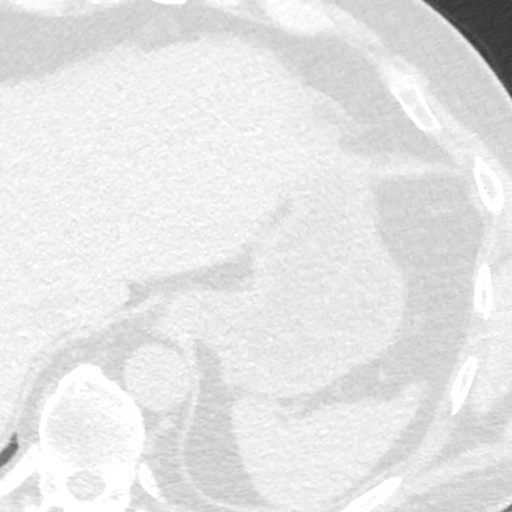
[im 10/42  vessel]
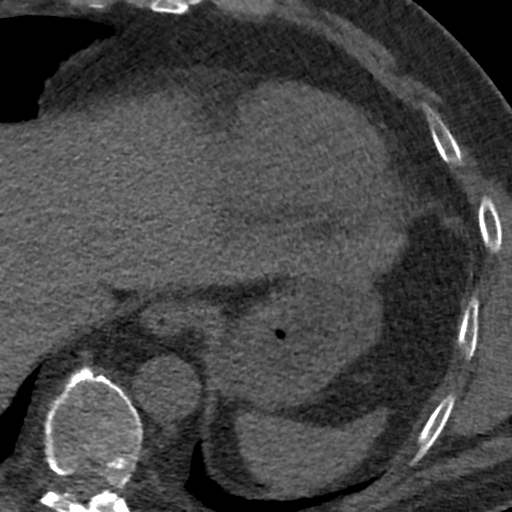
[im 14/42  vessel]
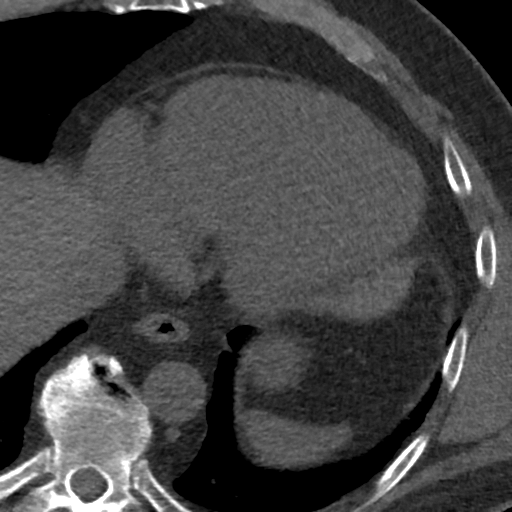
[im 19/42  vessel]
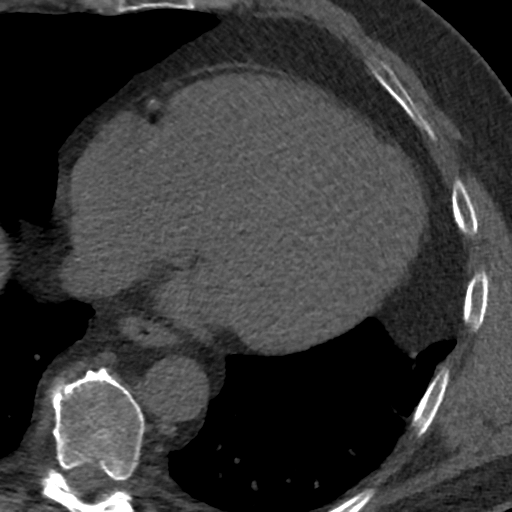
[im 23/42  vessel]
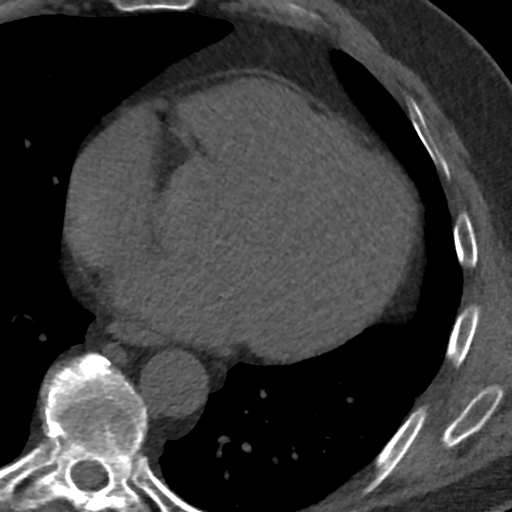
[im 23/42  lung]
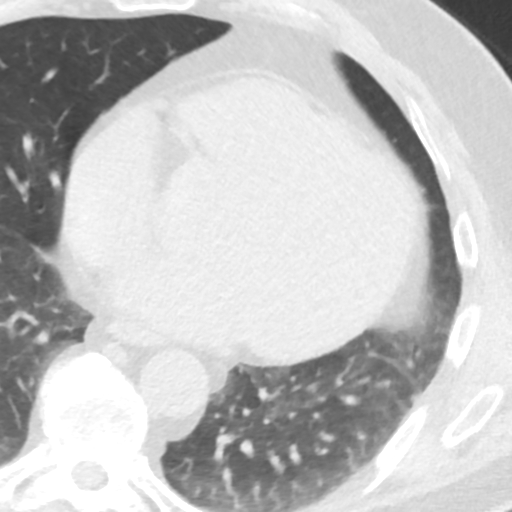
[im 28/42  vessel]
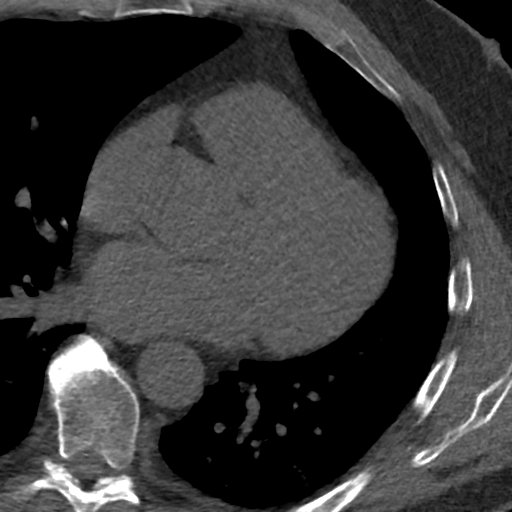
[im 32/42  vessel]
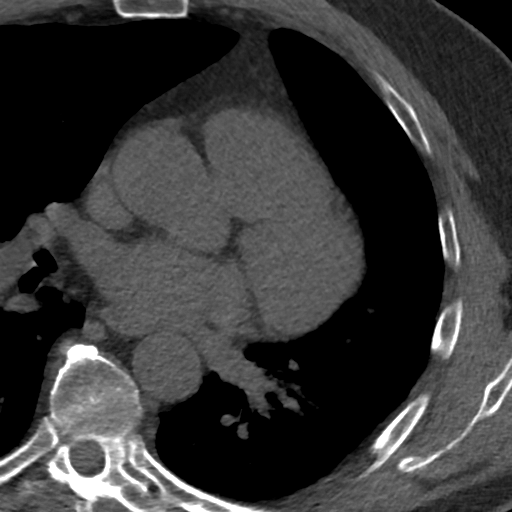
[im 37/42  vessel]
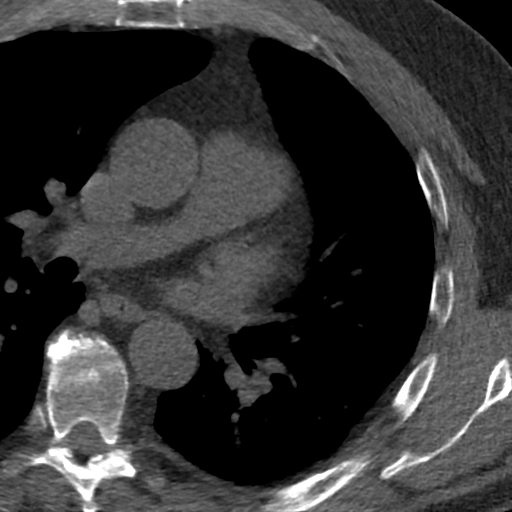

[Series 4: lung st 74 % · axial · 0.65mm/px · z∈[-294,-198]mm · 8 of 42 slices shown]
[im 5/42  lung]
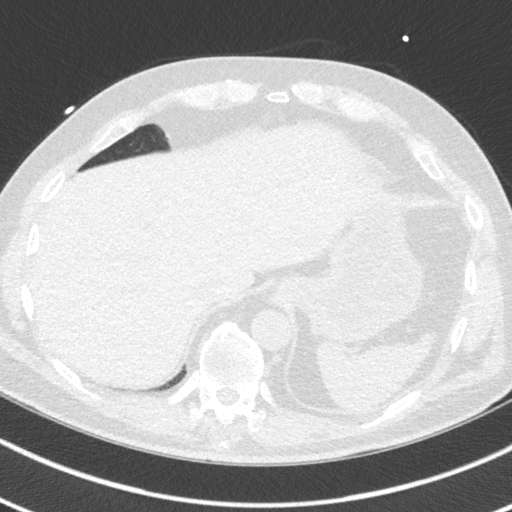
[im 10/42  lung]
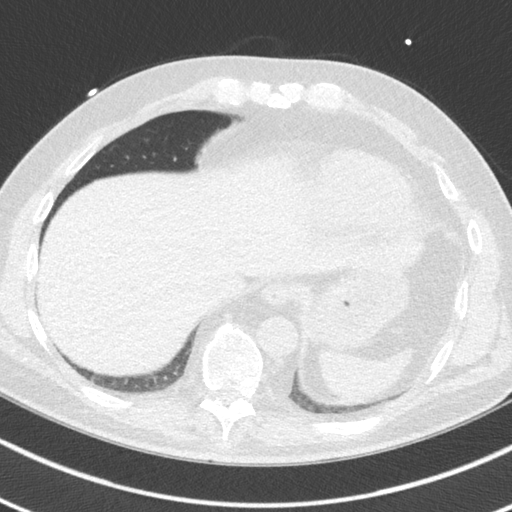
[im 14/42  lung]
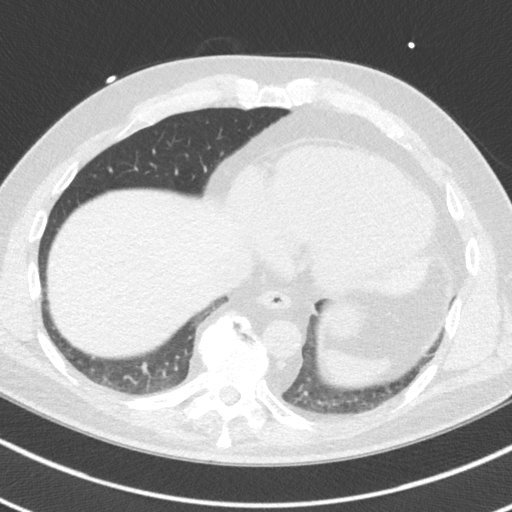
[im 19/42  lung]
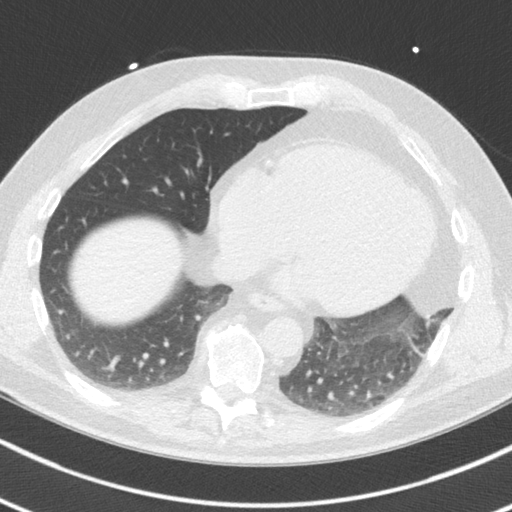
[im 23/42  lung]
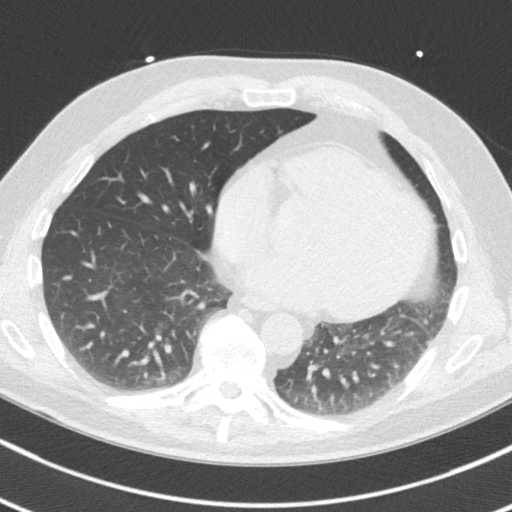
[im 28/42  lung]
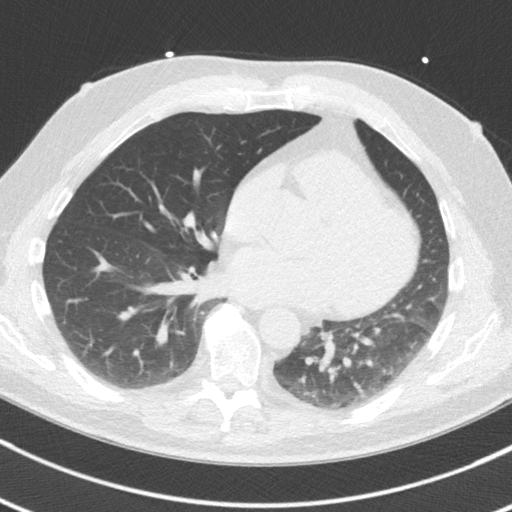
[im 32/42  lung]
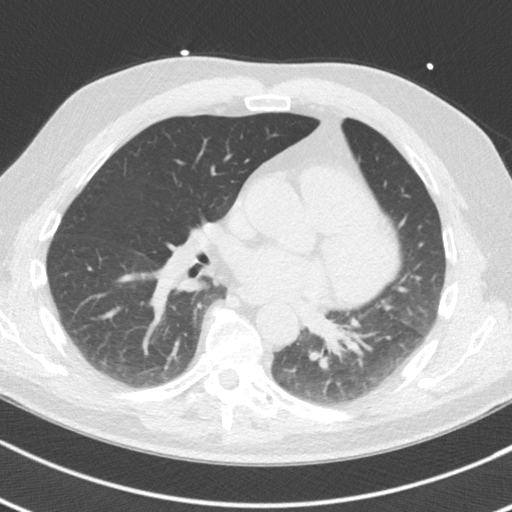
[im 37/42  lung]
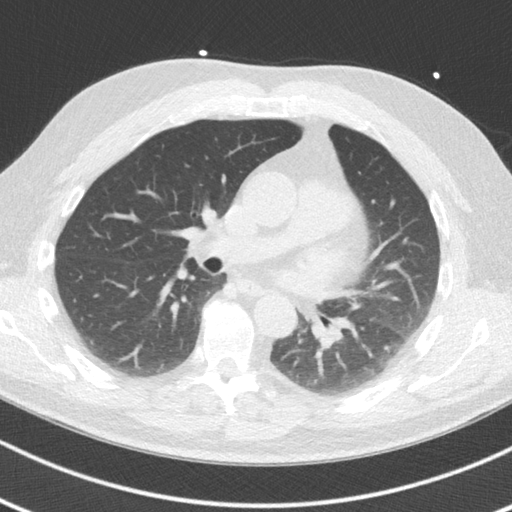

[16 of 20 positions shown; findings below may reference images not displayed]

FINDINGS: Within the visualized portions of the thorax there are no suspicious
appearing pulmonary nodules or masses, there is no acute
consolidative airspace disease, no pleural effusions, no
pneumothorax and no lymphadenopathy. Visualized portions of the
upper abdomen are unremarkable. There are no aggressive appearing
lytic or blastic lesions noted in the visualized portions of the
skeleton.
IMPRESSION: No significant incidental noncardiac findings are noted.
FINDINGS: Non-cardiac: See separate report from [REDACTED].

Ascending Aorta: Normal caliber.

Pericardium: Normal.

Coronary arteries: Normal origins.
IMPRESSION: Coronary calcium score of 121. This was 75th percentile for age and
sex matched controls.

*** End of Addendum ***
EXAM:
OVER-READ INTERPRETATION  CT CHEST

The following report is an over-read performed by radiologist Dr.
Fanm Hormasabal [REDACTED] on 10/31/2020. This
over-read does not include interpretation of cardiac or coronary
anatomy or pathology. The coronary calcium score/coronary CTA
interpretation by the cardiologist is attached.
FINDINGS: Within the visualized portions of the thorax there are no suspicious
appearing pulmonary nodules or masses, there is no acute
consolidative airspace disease, no pleural effusions, no
pneumothorax and no lymphadenopathy. Visualized portions of the
upper abdomen are unremarkable. There are no aggressive appearing
lytic or blastic lesions noted in the visualized portions of the
skeleton.
IMPRESSION: No significant incidental noncardiac findings are noted.

## 2021-10-22 ENCOUNTER — Other Ambulatory Visit: Payer: Self-pay

## 2021-10-22 MED ORDER — LOSARTAN POTASSIUM 50 MG PO TABS: 50.0000 mg | ORAL_TABLET | Freq: Every day | ORAL | 0 refills | Status: DC

## 2021-10-27 DIAGNOSIS — E785 Hyperlipidemia, unspecified: Secondary | ICD-10-CM | POA: Insufficient documentation

## 2021-10-27 NOTE — Progress Notes (Addendum)
HPI: [name] is a 60 y.o.male here today for his routine physical examination and follow up.  Last CPE: Over a year ago. Last follow up on 10/08/20. He lives with wife.  Regular exercise 3 or more times per week: 6 times per week wt lifting and cardio and hiking 1-2 times per week. Following a healthy diet: Yes, cooks at home.  Chronic medical problems: CAD,HTN,HLD. Follows with cardiologist, Dr Daniel Fritz, last seen on 02/06/2021.  Immunization History  Administered Date(s) Administered   Influenza,inj,Quad PF,6+ Mos 10/08/2020, 10/28/2021   PFIZER Comirnaty(Gray Top)Covid-19 Tri-Sucrose Vaccine 02/17/2020, 11/12/2020   PFIZER(Purple Top)SARS-COV-2 Vaccination 03/15/2020   Tdap 10/28/2021   Health Maintenance  Topic Date Due   COLONOSCOPY (Pts 45-47yrs Insurance coverage will need to be confirmed)  Never done   COVID-19 Vaccine (4 - Booster for [place]herty series) 11/13/2021 (Originally 01/07/2021)   Zoster Vaccines- Shingrix (1 of 2) 12/20/2021 (Originally 07/08/2012)   HIV Screening  10/28/2029 (Originally 07/08/1976)   TETANUS/TDAP  10/29/2031   INFLUENZA VACCINE  Completed   Hepatitis C Screening  Completed   HPV VACCINES  Aged Out   Pneumococcal Vaccine 20-38 Years old  Discontinued  Colonoscopy reported as current. It was done in [place] 2.5 years ago.  Nocturia x 0-1.  Negative for high alcohol intake or tobacco use. 2-3 glasses of red wine daily.  -Concerns and/or follow up today:   CAD on Crestor 20 mg daily.  Lab Results  Component Value Date   CHOL 167 12/23/2019   HDL 59.90 12/23/2019   LDLCALC 85 12/23/2019   TRIG 111.0 12/23/2019   CHOLHDL 3 12/23/2019   Bilateral high pinch tone in ears for about a year. Problem has been stable. No changes in hearing.  HTN: Currently on Losartan 50 mg daily. Home BP readings: 120's /80-90. Lab Results  Component Value Date   CREATININE 1.07 10/08/2020   BUN 19 10/08/2020   NA 139 10/08/2020   K 4.6  10/08/2020   CL 105 10/08/2020   CO2 26 10/08/2020   PHQ positive. Reporting hx of depression years ago. He was on Fluoxetine. He is not interested in treatment at this time.  Depression screen [facility] 2/9 10/28/2021 12/24/2019  Decreased Interest 1 0  Down, Depressed, Hopeless 1 0  PHQ - 2 Score 2 0  Altered sleeping 0 -  Tired, decreased energy 1 -  Change in appetite 0 -  Feeling bad or failure about yourself  0 -  Trouble concentrating 1 -  Moving slowly or fidgety/restless 0 -  Suicidal thoughts 0 -  PHQ-9 Score 4 -  Difficult doing work/chores Somewhat difficult -   Review of Systems  Constitutional:  Negative for activity change, appetite change, fatigue and fever.  HENT:  Positive for tinnitus. Negative for mouth sores, nosebleeds, sore throat and trouble swallowing.   Eyes:  Negative for redness and visual disturbance.  Respiratory:  Negative for cough, shortness of breath and wheezing.   Cardiovascular:  Negative for chest pain, palpitations and leg swelling.  Gastrointestinal:  Negative for abdominal pain, blood in stool, nausea and vomiting.  Endocrine: Negative for cold intolerance, heat intolerance, polydipsia, polyphagia and polyuria.  Genitourinary:  Negative for decreased urine volume, dysuria, genital sores, hematuria and testicular pain.  Musculoskeletal:  Negative for gait problem and myalgias.  Skin:  Negative for color change and rash.  Allergic/Immunologic: Negative for environmental allergies.  Neurological:  Negative for syncope, weakness and headaches.  Hematological:  Negative for adenopathy.  Does not bruise/bleed easily.  Psychiatric/Behavioral:  Negative for confusion.    Current Outpatient Medications on File Prior to Visit  Medication Sig Dispense Refill   aspirin EC 81 MG tablet Take 81 mg by mouth daily. Swallow whole.     losartan (COZAAR) 50 MG tablet Take 1 tablet (50 mg total) by mouth daily. 90 tablet 0   rosuvastatin (CRESTOR) 20 MG tablet  Take 1 tablet (20 mg total) by mouth daily. 90 tablet 3   No current facility-administered medications on file prior to visit.    Past Medical History:  Diagnosis Date   Hypertension    History reviewed. No pertinent surgical history.  No Known Allergies  Family History  Problem Relation Age of Onset   Stroke Mother    Arrhythmia Mother     Social History   Socioeconomic History   Marital status: Married    Spouse name: Not on file   Number of children: 2   Years of education: Not on file   Highest education level: Not on file  Occupational History   Occupation: Finance  Tobacco Use   Smoking status: Never   Smokeless tobacco: Never  Substance and Sexual Activity   Alcohol use: Yes    Alcohol/week: 2.0 standard drinks    Types: 2 Glasses of wine per week   Drug use: Never   Sexual activity: Not on file  Other Topics Concern   Not on file  Social History Narrative   Not on file   Social Determinants of Health   Financial Resource Strain: Not on file  Food Insecurity: Not on file  Transportation Needs: Not on file  Physical Activity: Not on file  Stress: Not on file  Social Connections: Not on file   Vitals:   10/28/21 0909  BP: 132/80  Pulse: 70  Resp: 16  Temp: 98.5 F (36.9 C)  SpO2: 99%   Body mass index is 29.19 kg/m.  Wt Readings from Last 3 Encounters:  10/28/21 186 lb 6.4 oz (84.6 kg)  02/06/21 182 lb (82.6 kg)  10/10/20 187 lb (84.8 kg)   Physical Exam Vitals and nursing note reviewed.  Constitutional:      General: He is not in acute distress.    Appearance: He is well-developed.  HENT:     Head: Normocephalic and atraumatic.     Right Ear: Tympanic membrane, ear canal and external ear normal.     Left Ear: Tympanic membrane, ear canal and external ear normal.     Mouth/Throat:     Mouth: Mucous membranes are moist.     Pharynx: Oropharynx is clear.  Eyes:     Extraocular Movements: Extraocular movements intact.      Conjunctiva/sclera: Conjunctivae normal.     Pupils: Pupils are equal, round, and reactive to light.  Neck:     Thyroid: No thyromegaly.     Trachea: No tracheal deviation.  Cardiovascular:     Rate and Rhythm: Normal rate and regular rhythm.     Pulses:          Dorsalis pedis pulses are 2+ on the right side and 2+ on the left side.     Heart sounds: No murmur heard. Pulmonary:     Effort: Pulmonary effort is normal. No respiratory distress.     Breath sounds: Normal breath sounds.  Abdominal:     Palpations: Abdomen is soft. There is no hepatomegaly or mass.     Tenderness: There is no abdominal tenderness.  Genitourinary:    Comments: No concerns. Musculoskeletal:        General: No tenderness.     Cervical back: Normal range of motion.     Comments: No major deformities appreciated and no signs of synovitis.  Lymphadenopathy:     Cervical: No cervical adenopathy.     Upper Body:     Right upper body: No supraclavicular adenopathy.     Left upper body: No supraclavicular adenopathy.  Skin:    General: Skin is warm.     Findings: No erythema.  Neurological:     General: No focal deficit present.     Mental Status: He is alert and oriented to person, place, and time.     Cranial Nerves: No cranial nerve deficit.     Sensory: No sensory deficit.     Coordination: Coordination normal.     Gait: Gait normal.     Deep Tendon Reflexes:     Reflex Scores:      Bicep reflexes are 2+ on the right side and 2+ on the left side.      Patellar reflexes are 2+ on the right side and 2+ on the left side.  ASSESSMENT AND PLAN:  Mr.Cannen was seen today for annual exam and follow-up.  Diagnoses and all orders for this visit: Orders Placed This Encounter  Procedures   Flu Vaccine QUAD 41mo+IM (Fluarix, Fluzone & Alfiuria Quad PF)   Tdap vaccine greater than or equal to 7yo IM   Comprehensive metabolic panel   Hemoglobin A1c   Lipid panel   PSA(Must document that pt has been  informed of limitations of PSA testing.)   Lab Results  Component Value Date   PSA 1.06 10/28/2021   Lab Results  Component Value Date   CHOL 132 10/28/2021   HDL 61.80 10/28/2021   LDLCALC 49 10/28/2021   TRIG 106.0 10/28/2021   CHOLHDL 2 10/28/2021   Lab Results  Component Value Date   CREATININE 1.02 10/28/2021   BUN 20 10/28/2021   NA 139 10/28/2021   K 4.7 10/28/2021   CL 103 10/28/2021   CO2 27 10/28/2021   Lab Results  Component Value Date   ALT 18 10/28/2021   AST 20 10/28/2021   ALKPHOS 104 10/28/2021   BILITOT 0.8 10/28/2021   Lab Results  Component Value Date   HGBA1C 5.6 10/28/2021   Routine general medical examination at a health care facility We discussed the importance of regular physical activity and healthy diet for prevention of chronic illness and/or complications. Preventive guidelines reviewed. Vaccination updated. Instructed to sign a release form to obtain copy of colonoscopy report. We discussed alcohol intake recommendations. Next CPE in a year.  Benign essential hypertension BP today adequately controlled. Continue Losartan 50 mg daily and low salt diet. Goal BP at least < 140/90.  Hyperlipidemia, unspecified hyperlipidemia type Continue Crestor 20 mg daily and low fat diet.  Prostate cancer screening -     PSA(Must document that pt has been informed of limitations of PSA testing.)  Screening for endocrine, metabolic and immunity disorder -     Hemoglobin A1c -     Comprehensive metabolic panel  Need for influenza vaccination -     Flu Vaccine QUAD 98mo+IM (Fluarix, Fluzone & Alfiuria Quad PF)  Need for Tdap vaccination -     Tdap vaccine greater than or equal to 7yo IM  Return in 1 year (on 10/28/2022) for CPE.   Shalia Bartko G. [place], MD  [place]. [place] office.

## 2021-10-28 ENCOUNTER — Ambulatory Visit (INDEPENDENT_AMBULATORY_CARE_PROVIDER_SITE_OTHER): Payer: Managed Care, Other (non HMO) | Admitting: Family Medicine

## 2021-10-28 ENCOUNTER — Encounter: Payer: Self-pay | Admitting: Family Medicine

## 2021-10-28 VITALS — BP 132/80 | HR 70 | Temp 98.5°F | Resp 16 | Ht 67.0 in | Wt 186.4 lb

## 2021-10-28 DIAGNOSIS — Z23 Encounter for immunization: Secondary | ICD-10-CM

## 2021-10-28 DIAGNOSIS — I10 Essential (primary) hypertension: Secondary | ICD-10-CM | POA: Diagnosis not present

## 2021-10-28 DIAGNOSIS — Z1329 Encounter for screening for other suspected endocrine disorder: Secondary | ICD-10-CM | POA: Diagnosis not present

## 2021-10-28 DIAGNOSIS — E785 Hyperlipidemia, unspecified: Secondary | ICD-10-CM | POA: Diagnosis not present

## 2021-10-28 DIAGNOSIS — Z130 Encounter for screening for diseases of the blood and blood-forming organs and certain disorders involving the immune mechanism: Secondary | ICD-10-CM

## 2021-10-28 DIAGNOSIS — Z125 Encounter for screening for malignant neoplasm of prostate: Secondary | ICD-10-CM | POA: Diagnosis not present

## 2021-10-28 DIAGNOSIS — Z13228 Encounter for screening for other metabolic disorders: Secondary | ICD-10-CM

## 2021-10-28 DIAGNOSIS — Z13220 Encounter for screening for lipoid disorders: Secondary | ICD-10-CM

## 2021-10-28 DIAGNOSIS — Z0000 Encounter for general adult medical examination without abnormal findings: Secondary | ICD-10-CM

## 2021-10-28 LAB — LIPID PANEL
Cholesterol: 132 mg/dL (ref 0–200)
HDL: 61.8 mg/dL (ref >39.00)
LDL Cholesterol: 49 mg/dL (ref 0–99)
NonHDL: 69.82
Total CHOL/HDL Ratio: 2
Triglycerides: 106 mg/dL (ref 0.0–149.0)
VLDL: 21.2 mg/dL (ref 0.0–40.0)

## 2021-10-28 LAB — COMPREHENSIVE METABOLIC PANEL
ALT: 18 U/L (ref 0–53)
AST: 20 U/L (ref 0–37)
Albumin: 4.3 g/dL (ref 3.5–5.2)
Alkaline Phosphatase: 104 U/L (ref 39–117)
BUN: 20 mg/dL (ref 6–23)
CO2: 27 mEq/L (ref 19–32)
Calcium: 9.3 mg/dL (ref 8.4–10.5)
Chloride: 103 mEq/L (ref 96–112)
Creatinine, Ser: 1.02 mg/dL (ref 0.40–1.50)
GFR: 80.56 mL/min (ref >60.00)
Glucose, Bld: 93 mg/dL (ref 70–99)
Potassium: 4.7 mEq/L (ref 3.5–5.1)
Sodium: 139 mEq/L (ref 135–145)
Total Bilirubin: 0.8 mg/dL (ref 0.2–1.2)
Total Protein: 6.7 g/dL (ref 6.0–8.3)

## 2021-10-28 LAB — PSA: PSA: 1.06 ng/mL (ref 0.10–4.00)

## 2021-10-28 LAB — HEMOGLOBIN A1C: Hgb A1c MFr Bld: 5.6 % (ref 4.6–6.5)

## 2021-10-28 NOTE — Patient Instructions (Addendum)
A few things to remember from today's visit:  Routine general medical examination at a health care facility  Benign essential hypertension  Hyperlipidemia, unspecified hyperlipidemia type  Prostate cancer screening - Plan: PSA(Must document that pt has been informed of limitations of PSA testing.)  Screening for lipoid disorders - Plan: Lipid panel  Screening for endocrine, metabolic and immunity disorder - Plan: Comprehensive metabolic panel, Hemoglobin A1c  Let us know if you want to start medication for depression. Counseling is a good options.  If you need refills please call your pharmacy. Do not use My Chart to request refills or for acute issues that need immediate attention.    Please be sure medication list is accurate. If a new problem present, please set up appointment sooner than planned today.  Health Maintenance, Male Adopting a healthy lifestyle and getting preventive care are important in promoting health and wellness. Ask your health care provider about: The right schedule for you to have regular tests and exams. Things you can do on your own to prevent diseases and keep yourself healthy. What should I know about diet, weight, and exercise? Eat a healthy diet  Eat a diet that includes plenty of vegetables, fruits, low-fat dairy products, and lean protein. Do not eat a lot of foods that are high in solid fats, added sugars, or sodium. Maintain a healthy weight Body mass index (BMI) is a measurement that can be used to identify possible weight problems. It estimates body fat based on height and weight. Your health care provider can help determine your BMI and help you achieve or maintain a healthy weight. Get regular exercise Get regular exercise. This is one of the most important things you can do for your health. Most adults should: Exercise for at least 150 minutes each week. The exercise should increase your heart rate and make you sweat (moderate-intensity  exercise). Do strengthening exercises at least twice a week. This is in addition to the moderate-intensity exercise. Spend less time sitting. Even light physical activity can be beneficial. Watch cholesterol and blood lipids Have your blood tested for lipids and cholesterol at 59 years of age, then have this test every 5 years. You may need to have your cholesterol levels checked more often if: Your lipid or cholesterol levels are high. You are older than 59 years of age. You are at high risk for heart disease. What should I know about cancer screening? Many types of cancers can be detected early and may often be prevented. Depending on your health history and family history, you may need to have cancer screening at various ages. This may include screening for: Colorectal cancer. Prostate cancer. Skin cancer. Lung cancer. What should I know about heart disease, diabetes, and high blood pressure? Blood pressure and heart disease High blood pressure causes heart disease and increases the risk of stroke. This is more likely to develop in people who have high blood pressure readings or are overweight. Talk with your health care provider about your target blood pressure readings. Have your blood pressure checked: Every 3-5 years if you are 59-27 years of age. Every year if you are 26 years old or older. If you are between the ages of 3 and 59 and are a current or former smoker, ask your health care provider if you should have a one-time screening for abdominal aortic aneurysm (AAA). Diabetes Have regular diabetes screenings. This checks your fasting blood sugar level. Have the screening done: Once every three years after age 59  if you are at a normal weight and have a low risk for diabetes. More often and at a younger age if you are overweight or have a high risk for diabetes. What should I know about preventing infection? Hepatitis B If you have a higher risk for hepatitis B, you should be  screened for this virus. Talk with your health care provider to find out if you are at risk for hepatitis B infection. Hepatitis C Blood testing is recommended for: Everyone born from [birth year] through 1964. Anyone with known risk factors for hepatitis C. Sexually transmitted infections (STIs) You should be screened each year for STIs, including gonorrhea and chlamydia, if: You are sexually active and are younger than 59 years of age. You are older than 59 years of age and your health care provider tells you that you are at risk for this type of infection. Your sexual activity has changed since you were last screened, and you are at increased risk for chlamydia or gonorrhea. Ask your health care provider if you are at risk. Ask your health care provider about whether you are at high risk for HIV. Your health care provider may recommend a prescription medicine to help prevent HIV infection. If you choose to take medicine to prevent HIV, you should first get tested for HIV. You should then be tested every 3 months for as long as you are taking the medicine. Follow these instructions at home: Alcohol use Do not drink alcohol if your health care provider tells you not to drink. If you drink alcohol: Limit how much you have to 0-2 drinks a day. Know how much alcohol is in your drink. In the [place] one drink equals one 12 oz bottle of beer (355 mL), one 5 oz glass of wine (148 mL), or one 1 oz glass of hard liquor (44 mL). Lifestyle Do not use any products that contain nicotine or tobacco. These products include cigarettes, chewing tobacco, and vaping devices, such as e-cigarettes. If you need help quitting, ask your health care provider. Do not use street drugs. Do not share needles. Ask your health care provider for help if you need support or information about quitting drugs. General instructions Schedule regular health, dental, and eye exams. Stay current with your vaccines. Tell your health care  provider if: You often feel depressed. You have ever been abused or do not feel safe at home. Summary Adopting a healthy lifestyle and getting preventive care are important in promoting health and wellness. Follow your health care provider's instructions about healthy diet, exercising, and getting tested or screened for diseases. Follow your health care provider's instructions on monitoring your cholesterol and blood pressure. This information is not intended to replace advice given to you by your health care provider. Make sure you discuss any questions you have with your health care provider. Document Revised: 04/22/2021 Document Reviewed: 04/22/2021 Elsevier Patient Education  2022 [employer].

## 2021-11-12 ENCOUNTER — Other Ambulatory Visit: Payer: Self-pay | Admitting: Cardiovascular Disease

## 2021-11-12 ENCOUNTER — Other Ambulatory Visit: Payer: Self-pay | Admitting: Family Medicine

## 2022-05-13 ENCOUNTER — Other Ambulatory Visit: Payer: Self-pay | Admitting: Cardiovascular Disease

## 2022-06-10 ENCOUNTER — Other Ambulatory Visit: Payer: Self-pay | Admitting: Cardiovascular Disease

## 2022-10-28 NOTE — Progress Notes (Deleted)
HPI:  [name] is a 61 y.o.male here today for his routine physical examination.  Last CPE: 10/28/21  Regular exercise 3 or more times per week: *** Following a healthy diet: ***  Chronic medical problems: ***  Immunization History  Administered Date(s) Administered  . Influenza,inj,Quad PF,6+ Mos 10/08/2020, 10/28/2021  . PFIZER Comirnaty(Gray Top)Covid-19 Tri-Sucrose Vaccine 02/17/2020, 11/12/2020  . PFIZER(Purple Top)SARS-COV-2 Vaccination 03/15/2020  . Tdap 10/28/2021    Health Maintenance  Topic Date Due  . COLONOSCOPY (Pts 45-74yrs Insurance coverage will need to be confirmed)  Never done  . Zoster Vaccines- Shingrix (1 of 2) Never done  . COVID-19 Vaccine (4 - Pfizer series) 01/07/2021  . INFLUENZA VACCINE  07/15/2022  . HIV Screening  10/28/2029 (Originally 07/08/1976)  . TETANUS/TDAP  10/29/2031  . Hepatitis C Screening  Completed  . HPV VACCINES  Aged Out    Last prostate ca screening: ***  -Negative for high alcohol intake or tobacco use.  -Concerns and/or follow up today: ***  Review of Systems  Current Outpatient Medications on File Prior to Visit  Medication Sig Dispense Refill  . aspirin EC 81 MG tablet Take 81 mg by mouth daily. Swallow whole.    . losartan (COZAAR) 50 MG tablet Take 1 tablet (50 mg total) by mouth daily. 90 tablet 3  . rosuvastatin (CRESTOR) 20 MG tablet TAKE 1 TABLET BY MOUTH DAILY 30 tablet 8   No current facility-administered medications on file prior to visit.    Past Medical History:  Diagnosis Date  . Hypertension     No past surgical history on file.  No Known Allergies  Family History  Problem Relation Age of Onset  . Stroke Mother   . Arrhythmia Mother     Social History   Socioeconomic History  . Marital status: Married    Spouse name: Not on file  . Number of children: 2  . Years of education: Not on file  . Highest education level: Not on file  Occupational History  . Occupation: Finance   Tobacco Use  . Smoking status: Never  . Smokeless tobacco: Never  Substance and Sexual Activity  . Alcohol use: Yes    Alcohol/week: 2.0 standard drinks of alcohol    Types: 2 Glasses of wine per week  . Drug use: Never  . Sexual activity: Not on file  Other Topics Concern  . Not on file  Social History Narrative  . Not on file   Social Determinants of Health   Financial Resource Strain: Not on file  Food Insecurity: Not on file  Transportation Needs: Not on file  Physical Activity: Not on file  Stress: Not on file  Social Connections: Not on file    There were no vitals filed for this visit. There is no height or weight on file to calculate BMI.  Wt Readings from Last 3 Encounters:  10/28/21 186 lb 6.4 oz (84.6 kg)  02/06/21 182 lb (82.6 kg)  10/10/20 187 lb (84.8 kg)    Physical Exam Vitals and nursing note reviewed.  Constitutional:      General: He is not in acute distress.    Appearance: He is well-developed.  HENT:     Head: Normocephalic and atraumatic.     Right Ear: Tympanic membrane, ear canal and external ear normal.     Left Ear: Tympanic membrane, ear canal and external ear normal.  Eyes:     Extraocular Movements: Extraocular movements intact.  Conjunctiva/sclera: Conjunctivae normal.     Pupils: Pupils are equal, round, and reactive to light.  Neck:     Thyroid: No thyromegaly.     Trachea: No tracheal deviation.  Cardiovascular:     Rate and Rhythm: Normal rate and regular rhythm.     Pulses:          Dorsalis pedis pulses are 2+ on the right side and 2+ on the left side.     Heart sounds: No murmur heard. Pulmonary:     Effort: Pulmonary effort is normal. No respiratory distress.     Breath sounds: Normal breath sounds.  Abdominal:     Palpations: Abdomen is soft. There is no hepatomegaly or mass.     Tenderness: There is no abdominal tenderness.  Genitourinary:    Comments: No concerns. Musculoskeletal:        General: No  tenderness.     Cervical back: Normal range of motion.     Comments: No major deformities appreciated and no signs of synovitis.  Lymphadenopathy:     Cervical: No cervical adenopathy.     Upper Body:     Right upper body: No supraclavicular adenopathy.     Left upper body: No supraclavicular adenopathy.  Skin:    General: Skin is warm.     Findings: No erythema.  Neurological:     General: No focal deficit present.     Mental Status: He is alert and oriented to person, place, and time.     Cranial Nerves: No cranial nerve deficit.     Sensory: No sensory deficit.     Gait: Gait normal.     Deep Tendon Reflexes:     Reflex Scores:      Bicep reflexes are 2+ on the right side and 2+ on the left side.      Patellar reflexes are 2+ on the right side and 2+ on the left side. Psychiatric:        Mood and Affect: Mood and affect normal.   ASSESSMENT AND PLAN:  There are no diagnoses linked to this encounter. No orders of the defined types were placed in this encounter.   There are no diagnoses linked to this encounter.  No follow-ups on file.  Betty G. [place], MD  [facility]. [place] office.

## 2022-10-29 ENCOUNTER — Encounter: Payer: Managed Care, Other (non HMO) | Admitting: Family Medicine

## 2022-10-29 DIAGNOSIS — Z0000 Encounter for general adult medical examination without abnormal findings: Secondary | ICD-10-CM

## 2022-10-29 DIAGNOSIS — E785 Hyperlipidemia, unspecified: Secondary | ICD-10-CM

## 2022-10-29 DIAGNOSIS — I10 Essential (primary) hypertension: Secondary | ICD-10-CM

## 2022-10-29 DIAGNOSIS — Z125 Encounter for screening for malignant neoplasm of prostate: Secondary | ICD-10-CM

## 2022-11-06 ENCOUNTER — Other Ambulatory Visit: Payer: Self-pay | Admitting: Family Medicine

## 2022-12-17 NOTE — Progress Notes (Deleted)
HPI:  [name] is a 62 y.o.male with PMHx significant for HLD and HTN here today for his routine physical examination.  Last CPE: 10/28/21  Regular exercise 3 or more times per week: *** Following a healthy diet: ***  Immunization History  Administered Date(s) Administered   Influenza,inj,Quad PF,6+ Mos 10/08/2020, 10/28/2021   PFIZER Comirnaty(Gray Top)Covid-19 Tri-Sucrose Vaccine 02/17/2020, 11/12/2020   PFIZER(Purple Top)SARS-COV-2 Vaccination 03/15/2020   Tdap 10/28/2021    Health Maintenance  Topic Date Due   COLONOSCOPY (Pts 45-29yrs Insurance coverage will need to be confirmed)  Never done   Zoster Vaccines- Shingrix (1 of 2) Never done   INFLUENZA VACCINE  07/15/2022   COVID-19 Vaccine (4 - 2023-24 season) 08/15/2022   HIV Screening  10/28/2029 (Originally 07/08/1976)   DTaP/Tdap/Td (2 - Td or Tdap) 10/29/2031   Hepatitis C Screening  Completed   HPV VACCINES  Aged Out    Last prostate ca screening: *** Lab Results  Component Value Date   PSA 1.06 10/28/2021   -Negative for high alcohol intake or tobacco use.  -Concerns and/or follow up today: *** Lab Results  Component Value Date   CHOL 132 10/28/2021   HDL 61.80 10/28/2021   LDLCALC 49 10/28/2021   TRIG 106.0 10/28/2021   CHOLHDL 2 10/28/2021   Lab Results  Component Value Date   CREATININE 1.02 10/28/2021   BUN 20 10/28/2021   NA 139 10/28/2021   K 4.7 10/28/2021   CL 103 10/28/2021   CO2 27 10/28/2021   Review of Systems  Current Outpatient Medications on File Prior to Visit  Medication Sig Dispense Refill   aspirin EC 81 MG tablet Take 81 mg by mouth daily. Swallow whole.     losartan (COZAAR) 50 MG tablet TAKE ONE TABLET BY MOUTH DAILY 90 tablet 1   rosuvastatin (CRESTOR) 20 MG tablet TAKE 1 TABLET BY MOUTH DAILY 30 tablet 8   No current facility-administered medications on file prior to visit.    Past Medical History:  Diagnosis Date   Hypertension     No past surgical  history on file.  No Known Allergies  Family History  Problem Relation Age of Onset   Stroke Mother    Arrhythmia Mother     Social History   Socioeconomic History   Marital status: Married    Spouse name: Not on file   Number of children: 2   Years of education: Not on file   Highest education level: Not on file  Occupational History   Occupation: Finance  Tobacco Use   Smoking status: Never   Smokeless tobacco: Never  Substance and Sexual Activity   Alcohol use: Yes    Alcohol/week: 2.0 standard drinks of alcohol    Types: 2 Glasses of wine per week   Drug use: Never   Sexual activity: Not on file  Other Topics Concern   Not on file  Social History Narrative   Not on file   Social Determinants of Health   Financial Resource Strain: Not on file  Food Insecurity: Not on file  Transportation Needs: Not on file  Physical Activity: Not on file  Stress: Not on file  Social Connections: Not on file    There were no vitals filed for this visit. There is no height or weight on file to calculate BMI.  Wt Readings from Last 3 Encounters:  10/28/21 186 lb 6.4 oz (84.6 kg)  02/06/21 182 lb (82.6 kg)  10/10/20 187 lb (84.8  kg)    Physical Exam Vitals and nursing note reviewed.  Constitutional:      General: He is not in acute distress.    Appearance: He is well-developed.  HENT:     Head: Normocephalic and atraumatic.     Right Ear: Tympanic membrane, ear canal and external ear normal.     Left Ear: Tympanic membrane, ear canal and external ear normal.  Eyes:     Extraocular Movements: Extraocular movements intact.     Conjunctiva/sclera: Conjunctivae normal.     Pupils: Pupils are equal, round, and reactive to light.  Neck:     Thyroid: No thyromegaly.     Trachea: No tracheal deviation.  Cardiovascular:     Rate and Rhythm: Normal rate and regular rhythm.     Pulses:          Dorsalis pedis pulses are 2+ on the right side and 2+ on the left side.      Heart sounds: No murmur heard. Pulmonary:     Effort: Pulmonary effort is normal. No respiratory distress.     Breath sounds: Normal breath sounds.  Abdominal:     Palpations: Abdomen is soft. There is no hepatomegaly or mass.     Tenderness: There is no abdominal tenderness.  Genitourinary:    Comments: No concerns. Musculoskeletal:        General: No tenderness.     Cervical back: Normal range of motion.     Comments: No major deformities appreciated and no signs of synovitis.  Lymphadenopathy:     Cervical: No cervical adenopathy.     Upper Body:     Right upper body: No supraclavicular adenopathy.     Left upper body: No supraclavicular adenopathy.  Skin:    General: Skin is warm.     Findings: No erythema.  Neurological:     General: No focal deficit present.     Mental Status: He is alert and oriented to person, place, and time.     Cranial Nerves: No cranial nerve deficit.     Sensory: No sensory deficit.     Gait: Gait normal.     Deep Tendon Reflexes:     Reflex Scores:      Bicep reflexes are 2+ on the right side and 2+ on the left side.      Patellar reflexes are 2+ on the right side and 2+ on the left side. Psychiatric:        Mood and Affect: Mood and affect normal.     ASSESSMENT AND PLAN:  There are no diagnoses linked to this encounter. No orders of the defined types were placed in this encounter.   There are no diagnoses linked to this encounter.  No follow-ups on file.  Betty G. Martinique, MD  [facility]. [place] office.

## 2022-12-19 ENCOUNTER — Encounter: Payer: Managed Care, Other (non HMO) | Admitting: Family Medicine

## 2022-12-19 DIAGNOSIS — I10 Essential (primary) hypertension: Secondary | ICD-10-CM

## 2022-12-19 DIAGNOSIS — Z125 Encounter for screening for malignant neoplasm of prostate: Secondary | ICD-10-CM

## 2022-12-19 DIAGNOSIS — Z0000 Encounter for general adult medical examination without abnormal findings: Secondary | ICD-10-CM

## 2022-12-19 DIAGNOSIS — E785 Hyperlipidemia, unspecified: Secondary | ICD-10-CM

## 2023-03-16 ENCOUNTER — Other Ambulatory Visit: Payer: Self-pay | Admitting: Cardiovascular Disease

## 2023-03-23 ENCOUNTER — Other Ambulatory Visit: Payer: Self-pay | Admitting: Cardiovascular Disease

## 2023-05-13 ENCOUNTER — Other Ambulatory Visit: Payer: Self-pay | Admitting: Family Medicine

## 2023-07-09 ENCOUNTER — Telehealth: Payer: Self-pay | Admitting: Family Medicine

## 2023-07-09 NOTE — Telephone Encounter (Signed)
Prescription Request  07/09/2023  LOV:   10/2021. Advised spouse of last OV and that an OV may be needed  What is the name of the medication or equipment? losartan (COZAAR) 50 MG tablet   pt has been out for 3 days   Have you contacted your pharmacy to request a refill? Yes   Which pharmacy would you like this sent to?   [facility] [address] [place], [place] - [phone] [place] [address] Phone: [phone] Fax: [phone]    Patient notified that their request is being sent to the clinical staff for review and that they should receive a response within 2 business days.   Please advise at Mobile [phone] (mobile)

## 2023-07-10 MED ORDER — LOSARTAN POTASSIUM 50 MG PO TABS
50.0000 mg | ORAL_TABLET | Freq: Every day | ORAL | 0 refills | Status: AC
Start: 2023-07-10 — End: ?

## 2023-07-10 NOTE — Addendum Note (Signed)
Addended by: Kathreen Devoid on: 07/10/2023 07:03 AM   Modules accepted: Orders

## 2023-08-06 ENCOUNTER — Other Ambulatory Visit: Payer: Self-pay | Admitting: Family Medicine
